# Patient Record
Sex: Male | Born: 1955 | Race: Black or African American | Hispanic: No | Smoking: Current every day smoker
Health system: Southern US, Community
[De-identification: ages and names within clinical notes are randomized; demographics above are authoritative.]

## PROBLEM LIST (undated history)

## (undated) DIAGNOSIS — I10 Essential (primary) hypertension: Secondary | ICD-10-CM

## (undated) HISTORY — PX: LEG SURGERY: SHX1003

---

## 1998-08-07 ENCOUNTER — Emergency Department (HOSPITAL_COMMUNITY): Admission: EM | Admit: 1998-08-07 | Discharge: 1998-08-07 | Payer: Self-pay | Admitting: Emergency Medicine

## 1998-08-09 ENCOUNTER — Encounter: Admission: RE | Admit: 1998-08-09 | Discharge: 1998-11-07 | Payer: Self-pay | Admitting: Internal Medicine

## 2001-04-27 ENCOUNTER — Emergency Department (HOSPITAL_COMMUNITY): Admission: EM | Admit: 2001-04-27 | Discharge: 2001-04-27 | Payer: Self-pay | Admitting: Emergency Medicine

## 2001-04-27 ENCOUNTER — Encounter: Payer: Self-pay | Admitting: Emergency Medicine

## 2001-05-07 ENCOUNTER — Emergency Department (HOSPITAL_COMMUNITY): Admission: EM | Admit: 2001-05-07 | Discharge: 2001-05-07 | Payer: Self-pay | Admitting: Emergency Medicine

## 2005-10-20 ENCOUNTER — Emergency Department (HOSPITAL_COMMUNITY): Admission: EM | Admit: 2005-10-20 | Discharge: 2005-10-21 | Payer: Self-pay | Admitting: Emergency Medicine

## 2011-02-02 ENCOUNTER — Emergency Department (HOSPITAL_COMMUNITY)
Admission: EM | Admit: 2011-02-02 | Discharge: 2011-02-02 | Disposition: A | Discharge status: Home / Self Care | Payer: Self-pay | Attending: Emergency Medicine | Admitting: Emergency Medicine

## 2011-02-02 ENCOUNTER — Emergency Department (HOSPITAL_COMMUNITY): Payer: Self-pay

## 2011-02-02 DIAGNOSIS — R55 Syncope and collapse: Secondary | ICD-10-CM | POA: Insufficient documentation

## 2011-02-02 DIAGNOSIS — R42 Dizziness and giddiness: Secondary | ICD-10-CM | POA: Insufficient documentation

## 2011-02-02 LAB — COMPREHENSIVE METABOLIC PANEL
Albumin: 3.9 g/dL (ref 3.5–5.2)
Alkaline Phosphatase: 56 U/L (ref 39–117)
BUN: 15 mg/dL (ref 6–23)
Chloride: 103 mEq/L (ref 96–112)
Glucose, Bld: 116 mg/dL — ABNORMAL HIGH (ref 70–99)
Potassium: 3.3 mEq/L — ABNORMAL LOW (ref 3.5–5.1)
Total Bilirubin: 0.7 mg/dL (ref 0.3–1.2)

## 2011-02-02 LAB — DIFFERENTIAL
Basophils Absolute: 0 10*3/uL (ref 0.0–0.1)
Basophils Relative: 0 % (ref 0–1)
Eosinophils Absolute: 0.1 10*3/uL (ref 0.0–0.7)
Eosinophils Relative: 2 % (ref 0–5)
Lymphocytes Relative: 30 % (ref 12–46)
Lymphs Abs: 2.1 10*3/uL (ref 0.7–4.0)
Monocytes Absolute: 0.5 10*3/uL (ref 0.1–1.0)
Monocytes Relative: 7 % (ref 3–12)
Neutro Abs: 4.1 10*3/uL (ref 1.7–7.7)
Neutrophils Relative %: 60 % (ref 43–77)

## 2011-02-02 LAB — CBC
HCT: 40.3 % (ref 39.0–52.0)
Hemoglobin: 14.1 g/dL (ref 13.0–17.0)
MCH: 30.8 pg (ref 26.0–34.0)
MCHC: 35 g/dL (ref 30.0–36.0)
MCV: 88 fL (ref 78.0–100.0)
Platelets: 236 10*3/uL (ref 150–400)
RBC: 4.58 MIL/uL (ref 4.22–5.81)
RDW: 15 % (ref 11.5–15.5)
WBC: 6.8 10*3/uL (ref 4.0–10.5)

## 2011-02-02 LAB — POCT CARDIAC MARKERS
CKMB, poc: <1 ng/mL — ABNORMAL LOW (ref 1.0–8.0)
Myoglobin, poc: 85.6 ng/mL (ref 12–200)
Troponin i, poc: <0.05 ng/mL (ref 0.00–0.09)

## 2011-02-02 LAB — ETHANOL: Alcohol, Ethyl (B): 35 mg/dL — ABNORMAL HIGH (ref 0–10)

## 2011-07-15 ENCOUNTER — Emergency Department (HOSPITAL_COMMUNITY): Payer: Self-pay

## 2011-07-15 ENCOUNTER — Emergency Department (HOSPITAL_COMMUNITY)
Admission: EM | Admit: 2011-07-15 | Discharge: 2011-07-16 | Disposition: A | Discharge status: Home / Self Care | Payer: Self-pay | Attending: Emergency Medicine | Admitting: Emergency Medicine

## 2011-07-15 DIAGNOSIS — S0003XA Contusion of scalp, initial encounter: Secondary | ICD-10-CM | POA: Insufficient documentation

## 2011-07-15 DIAGNOSIS — M25519 Pain in unspecified shoulder: Secondary | ICD-10-CM | POA: Insufficient documentation

## 2011-07-15 DIAGNOSIS — R079 Chest pain, unspecified: Secondary | ICD-10-CM | POA: Insufficient documentation

## 2011-07-15 DIAGNOSIS — T07XXXA Unspecified multiple injuries, initial encounter: Secondary | ICD-10-CM | POA: Insufficient documentation

## 2011-07-15 DIAGNOSIS — S1093XA Contusion of unspecified part of neck, initial encounter: Secondary | ICD-10-CM | POA: Insufficient documentation

## 2011-07-15 DIAGNOSIS — R51 Headache: Secondary | ICD-10-CM | POA: Insufficient documentation

## 2011-07-15 DIAGNOSIS — S0230XA Fracture of orbital floor, unspecified side, initial encounter for closed fracture: Secondary | ICD-10-CM | POA: Insufficient documentation

## 2011-07-15 DIAGNOSIS — IMO0002 Reserved for concepts with insufficient information to code with codable children: Secondary | ICD-10-CM | POA: Insufficient documentation

## 2011-07-15 LAB — CBC
Hemoglobin: 14.9 g/dL (ref 13.0–17.0)
MCHC: 35.4 g/dL (ref 30.0–36.0)
Platelets: 244 10*3/uL (ref 150–400)
RDW: 15.1 % (ref 11.5–15.5)

## 2011-07-15 LAB — DIFFERENTIAL
Basophils Absolute: 0.1 10*3/uL (ref 0.0–0.1)
Basophils Relative: 0 % (ref 0–1)
Eosinophils Relative: 1 % (ref 0–5)
Monocytes Absolute: 0.5 10*3/uL (ref 0.1–1.0)
Neutro Abs: 8.7 10*3/uL — ABNORMAL HIGH (ref 1.7–7.7)

## 2011-07-15 LAB — PROTIME-INR
INR: 0.9 (ref 0.00–1.49)
Prothrombin Time: 12.3 seconds (ref 11.6–15.2)

## 2011-07-15 LAB — COMPREHENSIVE METABOLIC PANEL
Albumin: 4.3 g/dL (ref 3.5–5.2)
BUN: 14 mg/dL (ref 6–23)
Calcium: 9.9 mg/dL (ref 8.4–10.5)
Creatinine, Ser: 0.8 mg/dL (ref 0.50–1.35)
Total Protein: 7.9 g/dL (ref 6.0–8.3)

## 2011-07-15 LAB — ETHANOL: Alcohol, Ethyl (B): 167 mg/dL — ABNORMAL HIGH (ref 0–11)

## 2011-07-16 LAB — URINALYSIS, ROUTINE W REFLEX MICROSCOPIC
Leukocytes, UA: NEGATIVE
Nitrite: NEGATIVE
Specific Gravity, Urine: 1.006 (ref 1.005–1.030)
Urobilinogen, UA: 0.2 mg/dL (ref 0.0–1.0)
pH: 6.5 (ref 5.0–8.0)

## 2011-07-16 LAB — URINE MICROSCOPIC-ADD ON

## 2013-07-22 ENCOUNTER — Emergency Department (HOSPITAL_COMMUNITY)
Admission: EM | Admit: 2013-07-22 | Discharge: 2013-07-23 | Disposition: A | Discharge status: Home / Self Care | Payer: Self-pay | Attending: Emergency Medicine | Admitting: Emergency Medicine

## 2013-07-22 ENCOUNTER — Emergency Department (HOSPITAL_COMMUNITY): Payer: Self-pay

## 2013-07-22 ENCOUNTER — Encounter (HOSPITAL_COMMUNITY): Payer: Self-pay | Admitting: Emergency Medicine

## 2013-07-22 DIAGNOSIS — R062 Wheezing: Secondary | ICD-10-CM | POA: Insufficient documentation

## 2013-07-22 DIAGNOSIS — I959 Hypotension, unspecified: Secondary | ICD-10-CM | POA: Insufficient documentation

## 2013-07-22 DIAGNOSIS — E860 Dehydration: Secondary | ICD-10-CM

## 2013-07-22 DIAGNOSIS — R55 Syncope and collapse: Secondary | ICD-10-CM

## 2013-07-22 DIAGNOSIS — F17200 Nicotine dependence, unspecified, uncomplicated: Secondary | ICD-10-CM | POA: Insufficient documentation

## 2013-07-22 LAB — COMPREHENSIVE METABOLIC PANEL
AST: 18 U/L (ref 0–37)
Albumin: 3.8 g/dL (ref 3.5–5.2)
BUN: 21 mg/dL (ref 6–23)
Chloride: 99 mEq/L (ref 96–112)
Creatinine, Ser: 1.41 mg/dL — ABNORMAL HIGH (ref 0.50–1.35)
Total Bilirubin: 0.7 mg/dL (ref 0.3–1.2)
Total Protein: 7.4 g/dL (ref 6.0–8.3)

## 2013-07-22 LAB — CBC WITH DIFFERENTIAL/PLATELET
Basophils Absolute: 0 10*3/uL (ref 0.0–0.1)
Basophils Relative: 1 % (ref 0–1)
Eosinophils Absolute: 0 10*3/uL (ref 0.0–0.7)
HCT: 42.2 % (ref 39.0–52.0)
MCH: 31.1 pg (ref 26.0–34.0)
MCHC: 35.5 g/dL (ref 30.0–36.0)
Monocytes Absolute: 0.6 10*3/uL (ref 0.1–1.0)
Neutro Abs: 5.7 10*3/uL (ref 1.7–7.7)
Neutrophils Relative %: 66 % (ref 43–77)
RDW: 15.2 % (ref 11.5–15.5)

## 2013-07-22 LAB — CK: Total CK: 138 U/L (ref 7–232)

## 2013-07-22 MED ORDER — SODIUM CHLORIDE 0.9 % IV BOLUS (SEPSIS)
1000.0000 mL | Freq: Once | INTRAVENOUS | Status: AC
  Administered 2013-07-22: 1000 mL via INTRAVENOUS

## 2013-07-22 NOTE — ED Notes (Signed)
Per EMS: pt from home.  Was working outside all day and had near syncopal episode. Pt stated he remembers all the events. Also stated he has had an episode or two like this before.  SBP upon EMS arrival was 92. After NS SBP was 116. 20g L FA.

## 2013-07-22 NOTE — ED Notes (Signed)
Pt unable to give urine at this time.

## 2013-07-22 NOTE — ED Provider Notes (Signed)
CSN: [id number]     Arrival date & time 07/22/13  2120 History   First MD Initiated Contact with Patient 07/22/13 2128     Chief Complaint  Patient presents with  . Near Syncope  . Hypotension   (Consider location/radiation/quality/duration/timing/severity/associated sxs/prior Treatment) Patient is a 57 y.o. male presenting with syncope.  Loss of Consciousness Episode history:  Single (Near syncopal episode) Most recent episode:  Today Timing:  Unable to specify Progression:  Partially resolved Chronicity:  Recurrent Context: normal activity   Witnessed: yes   Worsened by:  Posture Associated symptoms: no chest pain, no diaphoresis, no nausea, no shortness of breath and no vomiting     History reviewed. No pertinent past medical history. History reviewed. No pertinent past surgical history. No family history on file. History  Substance Use Topics  . Smoking status: Current Every Day Smoker  . Smokeless tobacco: Never Used  . Alcohol Use: Yes    Review of Systems  Constitutional: Negative for diaphoresis.  Respiratory: Negative for shortness of breath.   Cardiovascular: Positive for syncope. Negative for chest pain.  Gastrointestinal: Negative for nausea and vomiting.  All other systems reviewed and are negative.    Allergies  Tomato and Aspirin  Home Medications  No current outpatient prescriptions on file. BP 144/101  Pulse 87  Temp(Src) 98.3 F (36.8 C) (Oral)  Resp 20  SpO2 96% Physical Exam  Nursing note and vitals reviewed. Constitutional: He is oriented to person, place, and time. He appears well-developed and well-nourished.  HENT:  Head: Normocephalic.  Eyes: EOM are normal. Pupils are equal, round, and reactive to light. Right eye exhibits no nystagmus. Left eye exhibits no nystagmus.  Neck: Normal range of motion. Neck supple.  Cardiovascular: Normal rate, regular rhythm and normal heart sounds.   Pulmonary/Chest: Effort normal. He has wheezes.   Abdominal: Soft. Bowel sounds are normal.  Musculoskeletal: Normal range of motion. He exhibits no edema and no tenderness.  Lymphadenopathy:    He has no cervical adenopathy.  Neurological: He is alert and oriented to person, place, and time.  Skin: Skin is warm and dry.  Psychiatric: He has a normal mood and affect. His behavior is normal. Judgment and thought content normal.    ED Course  Procedures (including critical care time) Labs Review Labs Reviewed - No data to display Imaging Review No results found.   Not currently orthostatic. ECG NSR--no chest pain.  Elevated creatinine, CK normal.  Suspect dehydration.  UA pending.  Patient signed out to H. Anne Shutter at shift change. MDM   Dehydration with near orthostatic syncope.    Jimmye Norman, NP 07/23/13 [phone]

## 2013-07-23 LAB — URINALYSIS, ROUTINE W REFLEX MICROSCOPIC
Glucose, UA: NEGATIVE mg/dL
Hgb urine dipstick: NEGATIVE
Leukocytes, UA: NEGATIVE
Specific Gravity, Urine: 1.013 (ref 1.005–1.030)
pH: 6 (ref 5.0–8.0)

## 2013-07-23 MED ORDER — SODIUM CHLORIDE 0.9 % IV BOLUS (SEPSIS)
1000.0000 mL | Freq: Once | INTRAVENOUS | Status: AC
  Administered 2013-07-23: 1000 mL via INTRAVENOUS

## 2013-07-23 NOTE — ED Provider Notes (Signed)
Medical screening examination/treatment/procedure(s) were performed by non-physician practitioner and as supervising physician I was immediately available for consultation/collaboration.  Toy Baker, MD 07/23/13 [phone]

## 2013-07-23 NOTE — ED Provider Notes (Signed)
  Patient signed out by Felicie Morn, NP.  Patient presented today with dehydration and syncope.  UA pending.  Plan is for the patient to be discharged once the UA has resulted.  UA does not show an infection.  Patient is stable for discharge.  Pascal Lux [place], PA-C 07/23/13 2238

## 2013-07-24 NOTE — ED Provider Notes (Signed)
Medical screening examination/treatment/procedure(s) were performed by non-physician practitioner and as supervising physician I was immediately available for consultation/collaboration.  Joyice Magda M Tori Cupps, MD 07/24/13 0435 

## 2014-04-30 ENCOUNTER — Emergency Department (HOSPITAL_COMMUNITY): Payer: Self-pay

## 2014-04-30 ENCOUNTER — Encounter (HOSPITAL_COMMUNITY): Payer: Self-pay | Admitting: Emergency Medicine

## 2014-04-30 ENCOUNTER — Emergency Department (HOSPITAL_COMMUNITY)
Admission: EM | Admit: 2014-04-30 | Discharge: 2014-04-30 | Disposition: A | Discharge status: Home / Self Care | Payer: Self-pay | Attending: Emergency Medicine | Admitting: Emergency Medicine

## 2014-04-30 DIAGNOSIS — R1011 Right upper quadrant pain: Secondary | ICD-10-CM | POA: Insufficient documentation

## 2014-04-30 DIAGNOSIS — I10 Essential (primary) hypertension: Secondary | ICD-10-CM | POA: Insufficient documentation

## 2014-04-30 DIAGNOSIS — R42 Dizziness and giddiness: Secondary | ICD-10-CM | POA: Insufficient documentation

## 2014-04-30 DIAGNOSIS — F17200 Nicotine dependence, unspecified, uncomplicated: Secondary | ICD-10-CM | POA: Insufficient documentation

## 2014-04-30 DIAGNOSIS — R079 Chest pain, unspecified: Secondary | ICD-10-CM | POA: Insufficient documentation

## 2014-04-30 DIAGNOSIS — R61 Generalized hyperhidrosis: Secondary | ICD-10-CM | POA: Insufficient documentation

## 2014-04-30 DIAGNOSIS — R112 Nausea with vomiting, unspecified: Secondary | ICD-10-CM | POA: Insufficient documentation

## 2014-04-30 DIAGNOSIS — Z79899 Other long term (current) drug therapy: Secondary | ICD-10-CM | POA: Insufficient documentation

## 2014-04-30 HISTORY — DX: Essential (primary) hypertension: I10

## 2014-04-30 LAB — CBC
HEMATOCRIT: 44.1 % (ref 39.0–52.0)
Hemoglobin: 15.3 g/dL (ref 13.0–17.0)
MCH: 30.7 pg (ref 26.0–34.0)
MCHC: 34.7 g/dL (ref 30.0–36.0)
MCV: 88.4 fL (ref 78.0–100.0)
PLATELETS: 242 10*3/uL (ref 150–400)
RBC: 4.99 MIL/uL (ref 4.22–5.81)
RDW: 14.4 % (ref 11.5–15.5)
WBC: 5.8 10*3/uL (ref 4.0–10.5)

## 2014-04-30 LAB — LIPASE, BLOOD: LIPASE: 24 U/L (ref 11–59)

## 2014-04-30 LAB — COMPREHENSIVE METABOLIC PANEL
ALBUMIN: 4.1 g/dL (ref 3.5–5.2)
ALK PHOS: 75 U/L (ref 39–117)
ALT: 15 U/L (ref 0–53)
AST: 35 U/L (ref 0–37)
BILIRUBIN TOTAL: 0.9 mg/dL (ref 0.3–1.2)
BUN: 21 mg/dL (ref 6–23)
CHLORIDE: 98 meq/L (ref 96–112)
CO2: 25 meq/L (ref 19–32)
Calcium: 9.9 mg/dL (ref 8.4–10.5)
Creatinine, Ser: 1.54 mg/dL — ABNORMAL HIGH (ref 0.50–1.35)
GFR calc Af Amer: 56 mL/min — ABNORMAL LOW (ref >90)
GFR, EST NON AFRICAN AMERICAN: 48 mL/min — AB (ref >90)
Glucose, Bld: 95 mg/dL (ref 70–99)
POTASSIUM: 4 meq/L (ref 3.7–5.3)
Sodium: 140 mEq/L (ref 137–147)
Total Protein: 8.3 g/dL (ref 6.0–8.3)

## 2014-04-30 LAB — I-STAT TROPONIN, ED: TROPONIN I, POC: 0 ng/mL (ref 0.00–0.08)

## 2014-04-30 MED ORDER — ONDANSETRON HCL 4 MG PO TABS: 4.0000 mg | ORAL_TABLET | Freq: Four times a day (QID) | ORAL | Status: DC

## 2014-04-30 MED ORDER — ALPRAZOLAM 0.5 MG PO TABS: 0.2500 mg | ORAL_TABLET | Freq: Once | ORAL | Status: DC

## 2014-04-30 NOTE — ED Provider Notes (Signed)
CSN: [id number]     Arrival date & time 04/30/14  1826 History   First MD Initiated Contact with Patient 04/30/14 1834     Chief Complaint  Patient presents with  . Abdominal Pain  . Chest Pain     (Consider location/radiation/quality/duration/timing/severity/associated sxs/prior Treatment) Patient is a 58 y.o. male presenting with abdominal pain. The history is provided by the patient. No language interpreter was used.  Abdominal Pain Pain location:  RUQ Pain quality: sharp   Pain radiates to:  Does not radiate Pain severity:  Moderate Onset quality:  Sudden Duration:  1 day Timing:  Intermittent Progression:  Resolved Chronicity:  Recurrent Context: not awakening from sleep, not diet changes, not eating, not medication withdrawal, not previous surgeries, not recent illness, not sick contacts, not suspicious food intake and not trauma   Relieved by: unsure. Worsened by:  Nothing tried Ineffective treatments:  None tried Associated symptoms: chest pain, nausea and vomiting   Associated symptoms: no constipation, no cough, no diarrhea, no dysuria, no fatigue, no fever and no shortness of breath     Past Medical History  Diagnosis Date  . Hypertension    History reviewed. No pertinent past surgical history. No family history on file. History  Substance Use Topics  . Smoking status: Current Every Day Smoker  . Smokeless tobacco: Never Used  . Alcohol Use: Yes    Review of Systems  Constitutional: Positive for diaphoresis. Negative for fever, activity change, appetite change and fatigue.  HENT: Negative for congestion, facial swelling, rhinorrhea and trouble swallowing.   Eyes: Negative for photophobia and pain.  Respiratory: Negative for cough, chest tightness and shortness of breath.   Cardiovascular: Positive for chest pain. Negative for leg swelling.  Gastrointestinal: Positive for nausea, vomiting and abdominal pain. Negative for diarrhea and constipation.    Endocrine: Negative for polydipsia and polyuria.  Genitourinary: Negative for dysuria, urgency, decreased urine volume and difficulty urinating.  Musculoskeletal: Negative for back pain and gait problem.  Skin: Negative for color change, rash and wound.  Allergic/Immunologic: Negative for immunocompromised state.  Neurological: Positive for dizziness. Negative for facial asymmetry, speech difficulty, weakness, numbness and headaches.  Psychiatric/Behavioral: Negative for confusion, decreased concentration and agitation.      Allergies  Tomato and Aspirin  Home Medications   Prior to Admission medications   Medication Sig Start Date End Date Taking? Authorizing Provider  ondansetron (ZOFRAN) 4 MG tablet Take 1 tablet (4 mg total) by mouth every 6 (six) hours. 04/30/14   Shanna Cisco, MD   BP 113/83  Pulse 85  Temp(Src) 97.7 F (36.5 C) (Oral)  Resp 16  SpO2 96% Physical Exam  Constitutional: He is oriented to person, place, and time. He appears well-developed and well-nourished. No distress.  HENT:  Head: Normocephalic and atraumatic.  Mouth/Throat: No oropharyngeal exudate.  Eyes: Pupils are equal, round, and reactive to light.  Neck: Normal range of motion. Neck supple.  Cardiovascular: Normal rate, regular rhythm and normal heart sounds.  Exam reveals no gallop and no friction rub.   No murmur heard. Pulmonary/Chest: Effort normal and breath sounds normal. No respiratory distress. He has no wheezes. He has no rales.  Abdominal: Soft. Bowel sounds are normal. He exhibits no distension and no mass. There is no tenderness. There is no rebound and no guarding.    Musculoskeletal: Normal range of motion. He exhibits no edema and no tenderness.  Neurological: He is alert and oriented to person, place, and time.  Skin:  Skin is warm and dry.  Psychiatric: He has a normal mood and affect.    ED Course  Procedures (including critical care time) Labs Review Labs Reviewed   COMPREHENSIVE METABOLIC PANEL - Abnormal; Notable for the following:    Creatinine, Ser 1.54 (*)    GFR calc non Af Amer 48 (*)    GFR calc Af Amer 56 (*)    All other components within normal limits  LIPASE, BLOOD  URINALYSIS, ROUTINE W REFLEX MICROSCOPIC  CBC  I-STAT TROPOININ, ED    Imaging Review Dg Chest 2 View  04/30/2014   CLINICAL DATA:  Right-sided chest pain  EXAM: CHEST  2 VIEW  COMPARISON:  07/22/2013  FINDINGS: The heart size and mediastinal contours are within normal limits. Both lungs are clear. The visualized skeletal structures are unremarkable.  IMPRESSION: No active cardiopulmonary disease.   Electronically Signed   By: Alcide Clever M.D.   On: 04/30/2014 19:51   US Abdomen Limited Ruq  04/30/2014   CLINICAL DATA:  Right upper quadrant pain.  EXAM: US ABDOMEN LIMITED - RIGHT UPPER QUADRANT  COMPARISON:  None.  FINDINGS: Gallbladder:  No gallstones or wall thickening visualized. No sonographic Murphy sign noted.  Common bile duct:  Diameter: Upper limits of normal measuring 7 mm.  Liver:  No focal lesion identified. Slight increased echogenicity. No biliary ductal dilatation.  IMPRESSION: No evidence for cholelithiasis or biliary ductal dilatation.   Electronically Signed   By: Davonna Belling M.D.   On: 04/30/2014 20:15     EKG Interpretation None      Date: 04/30/2014  Rate: 78  Rhythm: normal sinus rhythm  QRS Axis: normal  Intervals: normal  ST/T Wave abnormalities: nonspecific ST/T changes  Conduction Disutrbances:possible LA enlargement  Narrative Interpretation:   Old EKG Reviewed: none available    MDM   Final diagnoses:  RUQ pain    Pt is a 58 y.o. male with Pmhx as above who presents with 1 day of intermittent RUQ pain w/ assoc n/v, and episode of dizziness/diaphoresis. He reports hx of same in past. No no cardiovascular or gallbladder pathology. TIMI 0. He has no pain/no complaints currently.  No acute appearing ischemic changes of EKG, trop  negative, CMP w/ Cr 1.5, LFTs ovtherwise nml, lipase nml. RUQ [place] nml.  Pending acute abnormality, will d/c home w/ Rx for zofran.  Have counseled pt in return precautions for new or worsening symptoms as well as establishing with a local PCP, Referral to Dr. Ashley Royalty or [facility].        Shanna Cisco, MD 05/01/14 [phone]

## 2014-04-30 NOTE — ED Notes (Signed)
Per EMS: pt reports upper abdominal pain. 20 G L AC. 4 Zofran administered. EKG negative with EMS. Hx of HTN no medication to treat. 2 episodes of n/v en route.

## 2014-04-30 NOTE — Discharge Instructions (Signed)
Abdominal Pain, Adult °Many things can cause abdominal pain. Usually, abdominal pain is not caused by a disease and will improve without treatment. It can often be observed and treated at home. Your health care provider will do a physical exam and possibly order blood tests and X-rays to help determine the seriousness of your pain. However, in many cases, more time must pass before a clear cause of the pain can be found. Before that point, your health care provider may not know if you need more testing or further treatment. °HOME CARE INSTRUCTIONS  °Monitor your abdominal pain for any changes. The following actions may help to alleviate any discomfort you are experiencing: °· Only take over-the-counter or prescription medicines as directed by your health care provider. °· Do not take laxatives unless directed to do so by your health care provider. °· Try a clear liquid diet (broth, tea, or water) as directed by your health care provider. Slowly move to a bland diet as tolerated. °SEEK MEDICAL CARE IF: °· You have unexplained abdominal pain. °· You have abdominal pain associated with nausea or diarrhea. °· You have pain when you urinate or have a bowel movement. °· You experience abdominal pain that wakes you in the night. °· You have abdominal pain that is worsened or improved by eating food. °· You have abdominal pain that is worsened with eating fatty foods. °SEEK IMMEDIATE MEDICAL CARE IF:  °· Your pain does not go away within 2 hours. °· You have a fever. °· You keep throwing up (vomiting). °· Your pain is felt only in portions of the abdomen, such as the right side or the left lower portion of the abdomen. °· You pass bloody or black tarry stools. °MAKE SURE YOU: °· Understand these instructions.   °· Will watch your condition.   °· Will get help right away if you are not doing well or get worse.   °Document Released: 08/16/2005 Document Revised: 08/27/2013 Document Reviewed: 07/16/2013 °[employer]® Patient  Information ©2014 [employer]. ° °

## 2014-04-30 NOTE — ED Notes (Signed)
Main lab called to add CBC, Ria at lab states she will add.

## 2016-03-04 ENCOUNTER — Emergency Department (HOSPITAL_COMMUNITY)
Admission: EM | Admit: 2016-03-04 | Discharge: 2016-03-05 | Disposition: A | Discharge status: Home / Self Care | Payer: Self-pay | Attending: Emergency Medicine | Admitting: Emergency Medicine

## 2016-03-04 ENCOUNTER — Encounter (HOSPITAL_COMMUNITY): Payer: Self-pay | Admitting: Emergency Medicine

## 2016-03-04 DIAGNOSIS — M069 Rheumatoid arthritis, unspecified: Secondary | ICD-10-CM | POA: Insufficient documentation

## 2016-03-04 DIAGNOSIS — F17200 Nicotine dependence, unspecified, uncomplicated: Secondary | ICD-10-CM | POA: Insufficient documentation

## 2016-03-04 DIAGNOSIS — I10 Essential (primary) hypertension: Secondary | ICD-10-CM | POA: Insufficient documentation

## 2016-03-04 LAB — CBC WITH DIFFERENTIAL/PLATELET
BASOS ABS: 0 10*3/uL (ref 0.0–0.1)
BASOS PCT: 1 %
EOS ABS: 0.2 10*3/uL (ref 0.0–0.7)
EOS PCT: 3 %
HCT: 42.3 % (ref 39.0–52.0)
Hemoglobin: 14.5 g/dL (ref 13.0–17.0)
LYMPHS PCT: 55 %
Lymphs Abs: 3.8 10*3/uL (ref 0.7–4.0)
MCH: 30.4 pg (ref 26.0–34.0)
MCHC: 34.3 g/dL (ref 30.0–36.0)
MCV: 88.7 fL (ref 78.0–100.0)
Monocytes Absolute: 0.4 10*3/uL (ref 0.1–1.0)
Monocytes Relative: 6 %
Neutro Abs: 2.4 10*3/uL (ref 1.7–7.7)
Neutrophils Relative %: 35 %
PLATELETS: 298 10*3/uL (ref 150–400)
RBC: 4.77 MIL/uL (ref 4.22–5.81)
RDW: 14.5 % (ref 11.5–15.5)
WBC: 6.7 10*3/uL (ref 4.0–10.5)

## 2016-03-04 LAB — COMPREHENSIVE METABOLIC PANEL
ALBUMIN: 4.2 g/dL (ref 3.5–5.0)
ALT: 16 U/L — AB (ref 17–63)
AST: 27 U/L (ref 15–41)
Alkaline Phosphatase: 66 U/L (ref 38–126)
Anion gap: 13 (ref 5–15)
BUN: 11 mg/dL (ref 6–20)
CHLORIDE: 103 mmol/L (ref 101–111)
CO2: 20 mmol/L — AB (ref 22–32)
CREATININE: 1.02 mg/dL (ref 0.61–1.24)
Calcium: 9.2 mg/dL (ref 8.9–10.3)
GFR calc Af Amer: >60 mL/min (ref >60)
GFR calc non Af Amer: >60 mL/min (ref >60)
Glucose, Bld: 97 mg/dL (ref 65–99)
Potassium: 4.1 mmol/L (ref 3.5–5.1)
SODIUM: 136 mmol/L (ref 135–145)
Total Bilirubin: 0.5 mg/dL (ref 0.3–1.2)
Total Protein: 8.1 g/dL (ref 6.5–8.1)

## 2016-03-04 MED ORDER — PREDNISONE 20 MG PO TABS
60.0000 mg | ORAL_TABLET | Freq: Once | ORAL | Status: AC
  Administered 2016-03-04: 60 mg via ORAL
  Filled 2016-03-04: qty 3

## 2016-03-04 MED ORDER — PREDNISONE 20 MG PO TABS: 60.0000 mg | ORAL_TABLET | Freq: Every day | ORAL | Status: DC

## 2016-03-04 MED ORDER — ACETAMINOPHEN 500 MG PO TABS
1000.0000 mg | ORAL_TABLET | Freq: Once | ORAL | Status: AC
  Administered 2016-03-04: 1000 mg via ORAL
  Filled 2016-03-04: qty 2

## 2016-03-04 NOTE — ED Provider Notes (Signed)
CSN: [id number]     Arrival date & time 03/04/16  2137 History   By signing my name below, I, Cory Curtis, attest that this documentation has been prepared under the direction and in the presence of [name], MD.  Electronically Signed: Arlan Curtis, ED Scribe. 03/04/2016. 11:29 PM.   Chief Complaint  Patient presents with  . Hand Pain   HPI  HPI Comments: Cory Curtis is a 60 y.o. male with a PMHx of HTN who presents to the Emergency Department complaining of constant, ongoing bilateral hand pain with associated swelling x few months; worsened this evening. Pain is described as throbbing. No recent injury, trauma, or falls. No aggravating or alleviating factors reported. No OTC medications or home remedies attempted prior to arrival. No recent fever, chills, nausea, vomiting. Pt denies any history of same. Pt with known allergy to ASA.  PCP: No primary care provider on file.    Past Medical History  Diagnosis Date  . Hypertension    History reviewed. No pertinent past surgical history. No family history on file. Social History  Substance Use Topics  . Smoking status: Current Every Day Smoker  . Smokeless tobacco: Never Used  . Alcohol Use: Yes    Review of Systems  A complete 10 system review of systems was obtained and all systems are negative except as noted in the HPI and PMH.    Allergies  Tomato and Aspirin  Home Medications   Prior to Admission medications   Medication Sig Start Date End Date Taking? Authorizing Provider  ondansetron (ZOFRAN) 4 MG tablet Take 1 tablet (4 mg total) by mouth every 6 (six) hours. 04/30/14   Toy Cookey, MD   Triage Vitals: BP 137/93 mmHg  Pulse 85  Temp(Src) 97.2 F (36.2 C) (Oral)  Resp 18  Ht 5\' 9"  (1.753 m)  Wt 153 lb 2 oz (69.457 kg)  BMI 22.60 kg/m2  SpO2 92%   Physical Exam  Constitutional: He is oriented to person, place, and time. Vital signs are normal. He appears well-developed and well-nourished.  Non-toxic  appearance. He does not appear ill. No distress.  HENT:  Head: Normocephalic and atraumatic.  Nose: Nose normal.  Mouth/Throat: Oropharynx is clear and moist. No oropharyngeal exudate.  Eyes: Conjunctivae and EOM are normal. Pupils are equal, round, and reactive to light. No scleral icterus.  Neck: Normal range of motion. Neck supple. No tracheal deviation, no edema, no erythema and normal range of motion present. No thyroid mass and no thyromegaly present.  Cardiovascular: Normal rate, regular rhythm, S1 normal, S2 normal, normal heart sounds, intact distal pulses and normal pulses.  Exam reveals no gallop and no friction rub.   No murmur heard. Pulmonary/Chest: Effort normal and breath sounds normal. No respiratory distress. He has no wheezes. He has no rhonchi. He has no rales.  Abdominal: Soft. Normal appearance and bowel sounds are normal. He exhibits no distension, no ascites and no mass. There is no hepatosplenomegaly. There is no tenderness. There is no rebound, no guarding and no CVA tenderness.  Musculoskeletal: Normal range of motion. He exhibits edema. He exhibits no tenderness.  All 10 digits are diffusely swollen No erythema or TTP ROM of all limbs are normal   Lymphadenopathy:    He has no cervical adenopathy.  Neurological: He is alert and oriented to person, place, and time. He has normal strength. No cranial nerve deficit or sensory deficit.  Skin: Skin is warm, dry and intact. No petechiae and  no rash noted. He is not diaphoretic. No erythema. No pallor.  Psychiatric: He has a normal mood and affect. His behavior is normal. Judgment normal.  Nursing note and vitals reviewed.   ED Course  Procedures (including critical care time)  DIAGNOSTIC STUDIES: Oxygen Saturation is 92% on RA, adequate by my interpretation.    COORDINATION OF CARE: 11:13 PM- Will order CBC and CMP. Discussed treatment plan with pt at bedside and pt agreed to plan.     Labs Review Labs Reviewed   COMPREHENSIVE METABOLIC PANEL - Abnormal; Notable for the following:    CO2 20 (*)    ALT 16 (*)    All other components within normal limits  CBC WITH DIFFERENTIAL/PLATELET    Imaging Review No results found. I have personally reviewed and evaluated these images and lab results as part of my medical decision-making.   EKG Interpretation None      MDM   Final diagnoses:  None   Patient presents to the ED for hand pain.  PE is consistent with RA, he was advised to see a PCP within 3 days for further treatment.  He was given tylenol for pain and prednisone for his flare.  Will DC with Rx for 4 more days of prednisone.  He appears well and in NAD.  VS remain within his normal limits and he is safe for DC.    I personally performed the services described in this documentation, which was scribed in my presence. The recorded information has been reviewed and is accurate.     [name], MD 03/04/16 2330

## 2016-03-04 NOTE — ED Notes (Signed)
Dr. Oni at the bedside.  

## 2016-03-04 NOTE — Discharge Instructions (Signed)
Rheumatoid Arthritis Cory Curtis, take prednisone for 5 days to help with the pain and swelling in your hands.  Take tylenol daily for the pain. See a primary care doctor within 3 days for close follow up. If symptoms worsen, come back to the ED immediately.  Thank you. Rheumatoid arthritis is a disease that causes pain, puffiness (swelling), and stiffness of the joints. It is a long-term (chronic) disease. It can affect the whole body, even the eyes and lungs. Your doctor will work with you to find the best treatment option for you. This will depend on how the disease is progressing in your body. HOME CARE  Stay active, but lessen activity when the disease gets worse.  Eat healthy foods.  Put heat on the affected joints when you wake up and before activity. Keep the heat on for as long as told by your doctor.  Put ice on the affected joints after activity or exercise.  Put ice in a plastic bag.  Place a towel between your skin and the bag.  Leave the ice on for 15 to 20 minutes, 3 to 4 times a day.  Take all medicines and other dietary pills (supplements) only as told by your doctor. Your doctor may adjust your medicines every 3 months.  Use a splint as told by your doctor. Splints help keep joints in a certain position to keep the joint working right.  Do not sleep with pillows under your knees.  Go to programs that can keep you updated on treatments and ways to deal with your disease. GET HELP RIGHT AWAY IF:  You have times where you pass out (faint).  You have times where you are really weak.  You suddenly have a hot, painful joint that feels worse than your normal joint ache.  You have chills.  You have a fever.   This information is not intended to replace advice given to you by your health care provider. Make sure you discuss any questions you have with your health care provider.   Document Released: 01/29/2012 Document Revised: 11/27/2014 Document Reviewed:  01/29/2012 Elsevier Interactive Patient Education [employer]

## 2016-03-04 NOTE — ED Notes (Signed)
Pt. reports chronic bilateral hand pain with swelling onset last year worse these past several days , denies injury . No fever or chills.

## 2016-03-08 ENCOUNTER — Encounter: Payer: Self-pay | Admitting: Internal Medicine

## 2016-03-08 ENCOUNTER — Ambulatory Visit: Payer: Self-pay | Attending: Internal Medicine | Admitting: Internal Medicine

## 2016-03-08 VITALS — BP 162/76 | HR 88 | Temp 97.5°F | Ht 69.0 in | Wt 153.0 lb

## 2016-03-08 DIAGNOSIS — Z888 Allergy status to other drugs, medicaments and biological substances status: Secondary | ICD-10-CM | POA: Insufficient documentation

## 2016-03-08 DIAGNOSIS — M069 Rheumatoid arthritis, unspecified: Secondary | ICD-10-CM | POA: Insufficient documentation

## 2016-03-08 DIAGNOSIS — M1990 Unspecified osteoarthritis, unspecified site: Secondary | ICD-10-CM | POA: Insufficient documentation

## 2016-03-08 DIAGNOSIS — F17210 Nicotine dependence, cigarettes, uncomplicated: Secondary | ICD-10-CM | POA: Insufficient documentation

## 2016-03-08 DIAGNOSIS — I10 Essential (primary) hypertension: Secondary | ICD-10-CM | POA: Insufficient documentation

## 2016-03-08 MED ORDER — LISINOPRIL 20 MG PO TABS: 20.0000 mg | ORAL_TABLET | Freq: Every day | ORAL | Status: DC

## 2016-03-08 MED ORDER — IBUPROFEN 200 MG PO TABS: 200.0000 mg | ORAL_TABLET | Freq: Four times a day (QID) | ORAL | Status: DC | PRN

## 2016-03-08 NOTE — Progress Notes (Signed)
Patient here for rheumatoid arthritis in both hands with swelling up to both elbows.  Patient stated he went to the ED and they gave him Prednisone to reduce the swelling and patient reports it has helped.  Patient denies pain.    BP 162/76  [facility] note  Patient comes in for follow up from ED- Went to ED for hand pain. Thought to have RA and treated with short course of prednisone He is feeling much better with decreased pain and decreased swelling.   He has a long hx of swollen fingers/wrists. Hands are stiff for 1 hour every morning.  Currenlty feels well.  Past Medical History  Diagnosis Date  . Hypertension     Social History   Social History  . Marital Status: Divorced    Spouse Name: N/A  . Number of Children: N/A  . Years of Education: N/A   Occupational History  . Not on file.   Social History Main Topics  . Smoking status: Current Every Day Smoker -- 0.50 packs/day    Types: Cigarettes  . Smokeless tobacco: Never Used  . Alcohol Use: 3.0 oz/week    5 Cans of beer per week  . Drug Use: No  . Sexual Activity: Not on file   Other Topics Concern  . Not on file   Social History Narrative    History reviewed. No pertinent past surgical history.  History reviewed. No pertinent family history.  Allergies  Allergen Reactions  . Tomato Swelling    Swelling of the face  . Aspirin Other (See Comments)    "it's like I'm high and everything is moving slow"    Current Outpatient Prescriptions on File Prior to Visit  Medication Sig Dispense Refill  . predniSONE (DELTASONE) 20 MG tablet Take 3 tablets (60 mg total) by mouth daily. 12 tablet 0   No current facility-administered medications on file prior to visit.     patient denies chest pain, shortness of breath, orthopnea. Denies lower extremity edema, abdominal pain, change in appetite, change in bowel movements. Patient denies rashes, musculoskeletal complaints. No other specific complaints in a complete review  of systems.   BP 162/76 mmHg  Pulse 88  Temp(Src) 97.5 F (36.4 C) (Oral)  Ht 5\' 9"  (1.753 m)  Wt 153 lb (69.4 kg)  BMI 22.58 kg/m2  SpO2 99% Recheck bp at 150/100 nad Thin Chest clear cv- reg rate abd- soft, non tender extr- no edema  Musculosk: no active swelling or tenosynovitis. He has enlarged MCP/pip/dip joints diffusely.   Hypertension He has hypertension- needs treatment Start with ACE-i discussed side effects  Recheck in 2-4 weeks  Rheumatoid arthritis (HCC) This is a presumptive diagnosis- i'll check lab work now and have him f/u in 2 weeks.   Ok to take NSAIDs in the meantime i'd likely start with hydroxychloroquine   allergy to aspirin- can tolerate nsaids.

## 2016-03-08 NOTE — Assessment & Plan Note (Signed)
This is a presumptive diagnosis- i'll check lab work now and have him f/u in 2 weeks.   Ok to take NSAIDs in the meantime i'd likely start with hydroxychloroquine

## 2016-03-08 NOTE — Assessment & Plan Note (Signed)
He has hypertension- needs treatment Start with ACE-i discussed side effects  Recheck in 2-4 weeks

## 2016-03-09 LAB — RHEUMATOID FACTOR: RHEUMATOID FACTOR: 11 [IU]/mL (ref <=14)

## 2016-03-09 LAB — CYCLIC CITRUL PEPTIDE ANTIBODY, IGG

## 2016-03-29 ENCOUNTER — Encounter: Payer: Self-pay | Admitting: Family Medicine

## 2016-03-29 ENCOUNTER — Ambulatory Visit: Payer: Self-pay | Attending: Family Medicine | Admitting: Family Medicine

## 2016-03-29 VITALS — BP 148/94 | HR 90 | Temp 98.1°F | Resp 18 | Ht 69.0 in | Wt 153.0 lb

## 2016-03-29 DIAGNOSIS — M1991 Primary osteoarthritis, unspecified site: Secondary | ICD-10-CM | POA: Insufficient documentation

## 2016-03-29 DIAGNOSIS — M150 Primary generalized (osteo)arthritis: Secondary | ICD-10-CM

## 2016-03-29 DIAGNOSIS — M159 Polyosteoarthritis, unspecified: Secondary | ICD-10-CM

## 2016-03-29 DIAGNOSIS — Z79899 Other long term (current) drug therapy: Secondary | ICD-10-CM | POA: Insufficient documentation

## 2016-03-29 DIAGNOSIS — M62831 Muscle spasm of calf: Secondary | ICD-10-CM | POA: Insufficient documentation

## 2016-03-29 DIAGNOSIS — I10 Essential (primary) hypertension: Secondary | ICD-10-CM | POA: Insufficient documentation

## 2016-03-29 MED ORDER — NAPROXEN 500 MG PO TABS: 500.0000 mg | ORAL_TABLET | Freq: Two times a day (BID) | ORAL | Status: DC

## 2016-03-29 MED ORDER — AMLODIPINE BESYLATE 10 MG PO TABS: 10.0000 mg | ORAL_TABLET | Freq: Every day | ORAL | Status: DC

## 2016-03-29 MED ORDER — CYCLOBENZAPRINE HCL 10 MG PO TABS: 10.0000 mg | ORAL_TABLET | Freq: Every day | ORAL | Status: DC

## 2016-03-29 NOTE — Progress Notes (Signed)
Patient's here for f/up HTN and Arthiritis in arms and shoulder.  Pain rated 1/10 R hand, 5/10 L hand sometimes it's worse sometimes is not as bad.  Patient requesting Rx of ibuprofen verses OTC that's not working.  Patient states he took his meds at 7 this morning.

## 2016-03-29 NOTE — Addendum Note (Signed)
Addended by: Benjamin Stain L on: 03/29/2016 05:14 PM   Modules accepted: Kipp Brood

## 2016-03-29 NOTE — Patient Instructions (Signed)
Hypertension Hypertension, commonly called high blood pressure, is when the force of blood pumping through your arteries is too strong. Your arteries are the blood vessels that carry blood from your heart throughout your body. A blood pressure reading consists of a higher number over a lower number, such as 110/72. The higher number (systolic) is the pressure inside your arteries when your heart pumps. The lower number (diastolic) is the pressure inside your arteries when your heart relaxes. Ideally you want your blood pressure below 120/80. Hypertension forces your heart to work harder to pump blood. Your arteries may become narrow or stiff. Having untreated or uncontrolled hypertension can cause heart attack, stroke, kidney disease, and other problems. RISK FACTORS Some risk factors for high blood pressure are controllable. Others are not.  Risk factors you cannot control include:   Race. You may be at higher risk if you are African American.  Age. Risk increases with age.  Gender. Men are at higher risk than women before age 45 years. After age 65, women are at higher risk than men. Risk factors you can control include:  Not getting enough exercise or physical activity.  Being overweight.  Getting too much fat, sugar, calories, or salt in your diet.  Drinking too much alcohol. SIGNS AND SYMPTOMS Hypertension does not usually cause signs or symptoms. Extremely high blood pressure (hypertensive crisis) may cause headache, anxiety, shortness of breath, and nosebleed. DIAGNOSIS To check if you have hypertension, your health care provider will measure your blood pressure while you are seated, with your arm held at the level of your heart. It should be measured at least twice using the same arm. Certain conditions can cause a difference in blood pressure between your right and left arms. A blood pressure reading that is higher than normal on one occasion does not mean that you need treatment. If  it is not clear whether you have high blood pressure, you may be asked to return on a different day to have your blood pressure checked again. Or, you may be asked to monitor your blood pressure at home for 1 or more weeks. TREATMENT Treating high blood pressure includes making lifestyle changes and possibly taking medicine. Living a healthy lifestyle can help lower high blood pressure. You may need to change some of your habits. Lifestyle changes may include:  Following the DASH diet. This diet is high in fruits, vegetables, and whole grains. It is low in salt, red meat, and added sugars.  Keep your sodium intake below 2,300 mg per day.  Getting at least 30-45 minutes of aerobic exercise at least 4 times per week.  Losing weight if necessary.  Not smoking.  Limiting alcoholic beverages.  Learning ways to reduce stress. Your health care provider may prescribe medicine if lifestyle changes are not enough to get your blood pressure under control, and if one of the following is true:  You are 18-59 years of age and your systolic blood pressure is above 140.  You are 60 years of age or older, and your systolic blood pressure is above 150.  Your diastolic blood pressure is above 90.  You have diabetes, and your systolic blood pressure is over 140 or your diastolic blood pressure is over 90.  You have kidney disease and your blood pressure is above 140/90.  You have heart disease and your blood pressure is above 140/90. Your personal target blood pressure may vary depending on your medical conditions, your age, and other factors. HOME CARE INSTRUCTIONS    Have your blood pressure rechecked as directed by your health care provider.   Take medicines only as directed by your health care provider. Follow the directions carefully. Blood pressure medicines must be taken as prescribed. The medicine does not work as well when you skip doses. Skipping doses also puts you at risk for  problems.  Do not smoke.   Monitor your blood pressure at home as directed by your health care provider. SEEK MEDICAL CARE IF:   You think you are having a reaction to medicines taken.  You have recurrent headaches or feel dizzy.  You have swelling in your ankles.  You have trouble with your vision. SEEK IMMEDIATE MEDICAL CARE IF:  You develop a severe headache or confusion.  You have unusual weakness, numbness, or feel faint.  You have severe chest or abdominal pain.  You vomit repeatedly.  You have trouble breathing. MAKE SURE YOU:   Understand these instructions.  Will watch your condition.  Will get help right away if you are not doing well or get worse.   This information is not intended to replace advice given to you by your health care provider. Make sure you discuss any questions you have with your health care provider.   Document Released: 11/06/2005 Document Revised: 03/23/2015 Document Reviewed: 08/29/2013 Elsevier Interactive Patient Education 2016 [employer] Inc[employer]  

## 2016-03-29 NOTE — Addendum Note (Signed)
Addended by: Benjamin Stain L on: 03/29/2016 03:58 PM   Modules accepted: Kipp Brood

## 2016-03-29 NOTE — Progress Notes (Signed)
Subjective:  Patient ID: Cory Curtis, male    DOB: 11-Oct-1956  Age: 60 y.o. MRN: [id number]  CC: Follow-up; Hypertension; and Arthritis   HPI [name] is a 60 year old male with a history of newly diagnosed hypertension commenced on lisinopril by Dr. Cato Mulligan at his last office visit at which time he also had swelling of his knuckles and blood work was sent off to exclude rheumatoid arthritis. Review of his labs indicate a normal CCP. He has been on OTC ibuprofen which does not help much. He complains of reduced range abduction of his left shoulder and occasional hand pain worse in the mornings with associated swelling. He also complains of right cough pain which occurs at night but denies claudication pains.  Outpatient Prescriptions Prior to Visit  Medication Sig Dispense Refill  . lisinopril (PRINIVIL,ZESTRIL) 20 MG tablet Take 1 tablet (20 mg total) by mouth daily. 90 tablet 3  . ibuprofen (ADVIL) 200 MG tablet Take 1 tablet (200 mg total) by mouth every 6 (six) hours as needed. 30 tablet 0  . predniSONE (DELTASONE) 20 MG tablet Take 3 tablets (60 mg total) by mouth daily. (Patient not taking: Reported on 03/29/2016) 12 tablet 0   No facility-administered medications prior to visit.    ROS Review of Systems  Constitutional: Negative for activity change and appetite change.  HENT: Negative for sinus pressure and sore throat.   Respiratory: Negative for chest tightness, shortness of breath and wheezing.   Cardiovascular: Negative for chest pain and palpitations.  Gastrointestinal: Negative for abdominal pain, constipation and abdominal distention.  Genitourinary: Negative.   Musculoskeletal:       See HPI  Psychiatric/Behavioral: Negative for behavioral problems and dysphoric mood.    Objective:  BP 164/125 mmHg  Pulse 90  Temp(Src) 98.1 F (36.7 C) (Oral)  Resp 18  Ht 5\' 9"  (1.753 m)  Wt 153 lb (69.4 kg)  BMI 22.58 kg/m2  SpO2 96%  BP/Weight 03/29/2016  03/08/2016 03/04/2016  Systolic BP 164 162 114  Diastolic BP 125 76 83  Wt. (Lbs) 153 153 153.13  BMI 22.58 22.58 22.6      Physical Exam  Constitutional: He is oriented to person, place, and time. He appears well-developed and well-nourished.  Cardiovascular: Normal rate, normal heart sounds and intact distal pulses.   No murmur heard. Pulmonary/Chest: Effort normal and breath sounds normal. He has no wheezes. He has no rales. He exhibits no tenderness.  Abdominal: Soft. Bowel sounds are normal. He exhibits no distension and no mass. There is no tenderness.  Musculoskeletal:  Mild enlargement of the proximal interphalangeal joints and presence of Bouchard's nodules Range of motion of left shoulder limited to 100; range of motion in right shoulder is normal.  Neurological: He is alert and oriented to person, place, and time.   CMP Latest Ref Rng 03/04/2016 04/30/2014 07/22/2013  Glucose 65 - 99 mg/dL 97 95 94  BUN 6 - 20 mg/dL 11 21 21   Creatinine 0.61 - 1.24 mg/dL 09/21/2013 ) 6.22)  Sodium 135 - 145 mmol/L 136 140 138  Potassium 3.5 - 5.1 mmol/L 4.1 4.0 3.9  Chloride 101 - 111 mmol/L 103 98 99  CO2 22 - 32 mmol/L 20(L) 25 24  Calcium 8.9 - 10.3 mg/dL 9.2 9.9 9.8  Total Protein 6.5 - 8.1 g/dL 8.1 8.3 7.4  Total Bilirubin 0.3 - 1.2 mg/dL 0.5 0.9 0.7  Alkaline Phos 38 - 126 U/L 66 75 62  AST 15 - 41 U/L  27 35 18  ALT 17 - 63 U/L 16(L) 15 12      Assessment & Plan:   1. Primary osteoarthritis involving multiple joints Labs negative for rheumatoid arthritis. Uncontrolled on ibuprofen If naproxen is not working I will add tramadol to regimen at his next visit - naproxen (NAPROSYN) 500 MG tablet; Take 1 tablet (500 mg total) by mouth 2 (two) times daily with a meal.  Dispense: 60 tablet; Refill: 2  2. Muscle spasm of calf - cyclobenzaprine (FLEXERIL) 10 MG tablet; Take 1 tablet (10 mg total) by mouth at bedtime. As needed  Dispense: 30 tablet; Refill: 0  3. Essential  hypertension Uncontrolled NSAID effect also contributory. Add amlodipine to regimen. Basic metabolic panel to evaluate for hyperkalemia given he is on lisinopril. - amLODipine (NORVASC) 10 MG tablet; Take 1 tablet (10 mg total) by mouth daily.  Dispense: 30 tablet; Refill: 3 - Basic Metabolic Panel   Meds ordered this encounter  Medications  . amLODipine (NORVASC) 10 MG tablet    Sig: Take 1 tablet (10 mg total) by mouth daily.    Dispense:  30 tablet    Refill:  3  . cyclobenzaprine (FLEXERIL) 10 MG tablet    Sig: Take 1 tablet (10 mg total) by mouth at bedtime. As needed    Dispense:  30 tablet    Refill:  0  . naproxen (NAPROSYN) 500 MG tablet    Sig: Take 1 tablet (500 mg total) by mouth 2 (two) times daily with a meal.    Dispense:  60 tablet    Refill:  2    Follow-up: Return in about 3 weeks (around 04/19/2016) for Follow-up of hypertension.   Jaclyn Shaggy MD

## 2016-03-30 LAB — BASIC METABOLIC PANEL
BUN: 10 mg/dL (ref 7–25)
CO2: 26 mmol/L (ref 20–31)
Calcium: 9.4 mg/dL (ref 8.6–10.3)
Chloride: 101 mmol/L (ref 98–110)
Creat: 1.01 mg/dL (ref 0.70–1.25)
GLUCOSE: 86 mg/dL (ref 65–99)
POTASSIUM: 4.3 mmol/L (ref 3.5–5.3)
SODIUM: 136 mmol/L (ref 135–146)

## 2016-04-03 ENCOUNTER — Telehealth: Payer: Self-pay

## 2016-04-03 NOTE — Telephone Encounter (Signed)
Contacted patient, patient verified name and DOB. Patient was given lab results verbalize understanding with no further questions. 

## 2016-04-03 NOTE — Telephone Encounter (Signed)
-----   Message from Jaclyn Shaggy, MD sent at 03/30/2016  9:07 AM EDT ----- Please inform the patient that labs are normal. Thank you.

## 2016-05-08 ENCOUNTER — Ambulatory Visit: Payer: Self-pay | Admitting: Family Medicine

## 2016-06-02 ENCOUNTER — Ambulatory Visit: Payer: Self-pay | Admitting: Family Medicine

## 2016-08-17 ENCOUNTER — Other Ambulatory Visit: Payer: Self-pay | Admitting: Family Medicine

## 2016-08-17 DIAGNOSIS — M159 Polyosteoarthritis, unspecified: Secondary | ICD-10-CM

## 2016-08-17 DIAGNOSIS — M150 Primary generalized (osteo)arthritis: Principal | ICD-10-CM

## 2017-10-17 ENCOUNTER — Ambulatory Visit: Payer: Self-pay | Attending: Nurse Practitioner | Admitting: Nurse Practitioner

## 2017-10-17 ENCOUNTER — Encounter: Payer: Self-pay | Admitting: Nurse Practitioner

## 2017-10-17 VITALS — BP 128/81 | HR 103 | Temp 98.1°F | Ht 70.0 in | Wt 155.6 lb

## 2017-10-17 DIAGNOSIS — I10 Essential (primary) hypertension: Secondary | ICD-10-CM

## 2017-10-17 DIAGNOSIS — M79642 Pain in left hand: Secondary | ICD-10-CM | POA: Insufficient documentation

## 2017-10-17 DIAGNOSIS — Z91018 Allergy to other foods: Secondary | ICD-10-CM | POA: Insufficient documentation

## 2017-10-17 DIAGNOSIS — Z888 Allergy status to other drugs, medicaments and biological substances status: Secondary | ICD-10-CM | POA: Insufficient documentation

## 2017-10-17 DIAGNOSIS — M7989 Other specified soft tissue disorders: Secondary | ICD-10-CM

## 2017-10-17 DIAGNOSIS — Z886 Allergy status to analgesic agent status: Secondary | ICD-10-CM | POA: Insufficient documentation

## 2017-10-17 DIAGNOSIS — M069 Rheumatoid arthritis, unspecified: Secondary | ICD-10-CM

## 2017-10-17 DIAGNOSIS — M79641 Pain in right hand: Secondary | ICD-10-CM | POA: Insufficient documentation

## 2017-10-17 MED ORDER — TRAMADOL HCL 50 MG PO TABS: 50.0000 mg | ORAL_TABLET | Freq: Three times a day (TID) | ORAL | 0 refills | Status: DC | PRN

## 2017-10-17 NOTE — Progress Notes (Signed)
Assessment & Plan:  Cory Curtis was seen today for establish care.  Diagnoses and all orders for this visit:  Bilateral hand swelling -     Uric Acid  Rheumatoid arthritis, involving unspecified site, unspecified rheumatoid factor presence (HCC) Ruled out for RA  Essential Hypertension Continue all antihypertensives as prescribed.  Remember to bring in your blood pressure log with you for your follow up appointment.  DASH/Mediterranean Diets are healthier choices for HTN.    Patient has been counseled on age-appropriate routine health concerns for screening and prevention. These are reviewed and up-to-date. Referrals have been placed accordingly. Immunizations are up-to-date or declined.    Subjective:   Chief Complaint  Patient presents with  . Establish Care    Patient stated that his hands, calf, and shoulder are in pain due to arthritis. Patient stated that he gets it during the day and night time. Patient would like medication refills.    HPI EUAL Curtis 61 y.o. male presents to office today with complaints of bilateral hand pain.   Hand Pain This is a chronic condition for the patient. He was ruled out for RA however continues with increased pain. Reports his hands are swollen constantly with pain 8-10/10. Aggravating factors: any hand movement. Descibes pain as stabbing and aching.  Onset a few years ago.  Has tried ibuprofen and naproxen. Unable to take prednisone due to extreme itching.  Relieving factors none: pain just comes and goes  Hypertension He has a history of hypertension however reports he has not taken any antihypertensives since last year. Will continue to monitor blood pressure closely. He denies chest pain, shortness of breath, lightheadedness, dizziness, palpitations or bilateral lower extremity edema.  BP Readings from Last 3 Encounters:  10/17/17 128/81  03/29/16 (!) 148/94  03/08/16 (!) 162/76    Review of Systems  Constitutional: Negative for  fever, malaise/fatigue and weight loss.  HENT: Negative.  Negative for nosebleeds.   Eyes: Negative.  Negative for blurred vision, double vision and photophobia.  Respiratory: Negative.  Negative for cough and shortness of breath.   Cardiovascular: Negative.  Negative for chest pain, palpitations and leg swelling.  Gastrointestinal: Negative.  Negative for abdominal pain, constipation, diarrhea, heartburn, nausea and vomiting.  Musculoskeletal: Negative.  Negative for myalgias.  Neurological: Negative.  Negative for dizziness, focal weakness, seizures and headaches.  Endo/Heme/Allergies: Negative for environmental allergies.  Psychiatric/Behavioral: Negative.  Negative for suicidal ideas.    Past Medical History:  Diagnosis Date  . Hypertension     History reviewed. No pertinent surgical history.  History reviewed. No pertinent family history.  Social History Reviewed with no changes to be made today.   Outpatient Medications Prior to Visit  Medication Sig Dispense Refill  . amLODipine (NORVASC) 10 MG tablet Take 1 tablet (10 mg total) by mouth daily. (Patient not taking: Reported on 10/17/2017) 30 tablet 3  . cyclobenzaprine (FLEXERIL) 10 MG tablet Take 1 tablet (10 mg total) by mouth at bedtime. As needed (Patient not taking: Reported on 10/17/2017) 30 tablet 0  . lisinopril (PRINIVIL,ZESTRIL) 20 MG tablet Take 1 tablet (20 mg total) by mouth daily. (Patient not taking: Reported on 10/17/2017) 90 tablet 3  . naproxen (NAPROSYN) 500 MG tablet TAKE 1 TABLET BY MOUTH TWICE DAILY WITH A MEAL (Patient not taking: Reported on 10/17/2017) 60 tablet 0   No facility-administered medications prior to visit.     Allergies  Allergen Reactions  . Tomato Swelling    Swelling of the face  .  Aspirin Other (See Comments)    "it's like I'm high and everything is moving slow"  . Predicort [Prednisolone] Hives       Objective:    BP 128/81 (BP Location: Left Arm, Patient Position: Sitting,  Cuff Size: Normal)   Pulse (!) 103   Temp 98.1 F (36.7 C) (Oral)   Ht 5\' 10"  (1.778 m)   Wt 155 lb 9.6 oz (70.6 kg)   SpO2 95%   BMI 22.33 kg/m  Wt Readings from Last 3 Encounters:  10/17/17 155 lb 9.6 oz (70.6 kg)  03/29/16 153 lb (69.4 kg)  03/08/16 153 lb (69.4 kg)    Physical Exam  Constitutional: He is oriented to person, place, and time. He appears well-developed and well-nourished. He is cooperative.  HENT:  Head: Normocephalic and atraumatic.  Eyes: EOM are normal.  Neck: Normal range of motion.  Cardiovascular: Normal rate, regular rhythm, normal heart sounds and intact distal pulses. Exam reveals no gallop and no friction rub.  No murmur heard. Pulmonary/Chest: Effort normal and breath sounds normal. No tachypnea. No respiratory distress. He has no decreased breath sounds. He has no wheezes. He has no rhonchi. He has no rales. He exhibits no tenderness.  Abdominal: Soft. Bowel sounds are normal.  Musculoskeletal: Normal range of motion. He exhibits no edema.       Right hand: He exhibits tenderness and swelling.       Left hand: He exhibits tenderness and swelling.  Neurological: He is alert and oriented to person, place, and time. Coordination normal.  Skin: Skin is warm and dry.  Psychiatric: He has a normal mood and affect. His behavior is normal. Judgment and thought content normal.  Nursing note and vitals reviewed.      Patient has been counseled extensively about nutrition and exercise as well as the importance of adherence with medications and regular follow-up. The patient was given clear instructions to go to ER or return to medical center if symptoms don't improve, worsen or new problems develop. The patient verbalized understanding.   Follow-up: Return in about 4 weeks (around 11/14/2017) for f/u hand swelling, Needs appointment with financial representative.11/16/2017, FNP-BC [facility] and [facility] [place],  [place]   10/20/2017, 9:18 AM

## 2017-10-18 LAB — URIC ACID: Uric Acid: 8.6 mg/dL (ref 3.7–8.6)

## 2017-10-19 ENCOUNTER — Other Ambulatory Visit: Payer: Self-pay | Admitting: Nurse Practitioner

## 2017-10-19 ENCOUNTER — Telehealth: Payer: Self-pay

## 2017-10-19 MED ORDER — TRAMADOL HCL 50 MG PO TABS: 50.0000 mg | ORAL_TABLET | Freq: Three times a day (TID) | ORAL | 0 refills | Status: DC | PRN

## 2017-10-19 NOTE — Telephone Encounter (Signed)
-----   Message from Claiborne Rigg, NP sent at 10/19/2017  9:13 AM EST ----- Please let patient know he does not have gout.

## 2017-10-20 ENCOUNTER — Encounter: Payer: Self-pay | Admitting: Nurse Practitioner

## 2017-10-22 NOTE — Telephone Encounter (Signed)
-----   Message from Zelda W Fleming, NP sent at 10/19/2017  9:13 AM EST ----- Please let patient know he does not have gout. 

## 2017-10-22 NOTE — Telephone Encounter (Signed)
Patient informed on lab result.   Patient verified DOB.  

## 2017-11-05 ENCOUNTER — Ambulatory Visit: Payer: Self-pay | Attending: Nurse Practitioner | Admitting: Nurse Practitioner

## 2017-11-05 ENCOUNTER — Encounter: Payer: Self-pay | Admitting: Nurse Practitioner

## 2017-11-05 VITALS — BP 145/84 | HR 87 | Temp 98.3°F | Ht 70.0 in | Wt 153.8 lb

## 2017-11-05 DIAGNOSIS — I10 Essential (primary) hypertension: Secondary | ICD-10-CM | POA: Insufficient documentation

## 2017-11-05 DIAGNOSIS — M159 Polyosteoarthritis, unspecified: Secondary | ICD-10-CM

## 2017-11-05 DIAGNOSIS — F17210 Nicotine dependence, cigarettes, uncomplicated: Secondary | ICD-10-CM | POA: Insufficient documentation

## 2017-11-05 DIAGNOSIS — Z1211 Encounter for screening for malignant neoplasm of colon: Secondary | ICD-10-CM | POA: Insufficient documentation

## 2017-11-05 DIAGNOSIS — Z0000 Encounter for general adult medical examination without abnormal findings: Secondary | ICD-10-CM

## 2017-11-05 DIAGNOSIS — M150 Primary generalized (osteo)arthritis: Secondary | ICD-10-CM

## 2017-11-05 DIAGNOSIS — M1991 Primary osteoarthritis, unspecified site: Secondary | ICD-10-CM | POA: Insufficient documentation

## 2017-11-05 MED ORDER — AMLODIPINE BESYLATE 10 MG PO TABS: 10.0000 mg | ORAL_TABLET | Freq: Every day | ORAL | 1 refills | Status: DC

## 2017-11-05 MED ORDER — NAPROXEN 500 MG PO TABS: 500.0000 mg | ORAL_TABLET | Freq: Two times a day (BID) | ORAL | 0 refills | Status: AC

## 2017-11-05 NOTE — Progress Notes (Signed)
Assessment & Plan:  Cory Curtis was seen today for follow-up.  Diagnoses and all orders for this visit:  Primary osteoarthritis involving multiple joints -     naproxen (NAPROSYN) 500 MG tablet; Take 1 tablet (500 mg total) by mouth 2 (two) times daily with a meal. May take in conjunction with tramadol for OA pain.  May need ABI at next office visit  Routine health maintenance -     CBC -     Basic metabolic panel -     Lipid panel  Colon cancer screening -     Ambulatory referral to Gastroenterology  Essential hypertension -     amLODipine (NORVASC) 10 MG tablet; Take 1 tablet (10 mg total) by mouth daily. Continue all antihypertensives as prescribed.  Remember to bring in your blood pressure log with you for your follow up appointment.  DASH/Mediterranean Diets are healthier choices for HTN.    Patient has been counseled on age-appropriate routine health concerns for screening and prevention. These are reviewed and up-to-date. Referrals have been placed accordingly. Immunizations are up-to-date or declined.    Subjective:   Chief Complaint  Patient presents with  . Follow-up    Patient stated his hand swelling has not gotten better and still comes with pain. Patient stated that both of his leg are in pain and a sharp needle pain when it goes numb.   HPI Cory Curtis 61 y.o. male presents to office today for follow up to OS. He was prescribed Tramadol at his last office visit for OA pain. Unfortunately due to miscommunication he did not take it in conjunction with his naprosyn. He reports somewhat better pain control with tramadol however still with some pain. He will still now take naprosyn and tramadol as adjunct to see if this will provide greater relief of his pain. Today he also reports experiencing ongoing sharp stabbing pain in the back of both of his legs. Pain occurs at rest and with activity. Relieving factors include rubbing or massaging his legs. Pain sometimes  wakes him up at night and can lasts for minutes to hours. He is still smoking 1/2 ppd but reports down from 1 and 1/2 ppd.  This could be a circulatory issue.   Essential Hypertension He has been out of his antihypertensives for over a year. Will refill today. He denies chest pain, shortness of breath, palpitations, lightheadedness, dizziness, headaches, or bilateral lower extremity edema.   Review of Systems  Constitutional: Negative for fever, malaise/fatigue and weight loss.  HENT: Negative.  Negative for nosebleeds.   Eyes: Negative.  Negative for blurred vision, double vision and photophobia.  Respiratory: Negative.  Negative for cough and shortness of breath.   Cardiovascular: Negative.  Negative for chest pain, palpitations and leg swelling.  Gastrointestinal: Negative.  Negative for abdominal pain, constipation, diarrhea, heartburn, nausea and vomiting.  Musculoskeletal: Positive for joint pain and myalgias.  Neurological: Positive for sensory change. Negative for dizziness, focal weakness, seizures and headaches.  Endo/Heme/Allergies: Negative for environmental allergies.  Psychiatric/Behavioral: Negative.  Negative for suicidal ideas.    Past Medical History:  Diagnosis Date  . Hypertension     History reviewed. No pertinent surgical history.  History reviewed. No pertinent family history.  Social History Reviewed with no changes to be made today.   Outpatient Medications Prior to Visit  Medication Sig Dispense Refill  . traMADol (ULTRAM) 50 MG tablet Take 1 tablet (50 mg total) by mouth every 8 (eight) hours as needed. 30  tablet 0   No facility-administered medications prior to visit.     Allergies  Allergen Reactions  . Tomato Swelling    Swelling of the face  . Aspirin Other (See Comments)    "it's like I'm high and everything is moving slow"  . Predicort [Prednisolone] Hives       Objective:    BP (!) 145/84 (BP Location: Left Arm, Patient Position:  Sitting, Cuff Size: Normal)   Pulse 87   Temp 98.3 F (36.8 C) (Oral)   Ht 5\' 10"  (1.778 m)   Wt 153 lb 12.8 oz (69.8 kg)   SpO2 100%   BMI 22.07 kg/m  Wt Readings from Last 3 Encounters:  11/05/17 153 lb 12.8 oz (69.8 kg)  10/17/17 155 lb 9.6 oz (70.6 kg)  03/29/16 153 lb (69.4 kg)    Physical Exam  Constitutional: He is oriented to person, place, and time. He appears well-developed and well-nourished. He is cooperative.  HENT:  Head: Normocephalic and atraumatic.  Eyes: EOM are normal.  Neck: Normal range of motion.  Cardiovascular: Normal rate, regular rhythm, normal heart sounds and intact distal pulses. Exam reveals no gallop and no friction rub.  No murmur heard. Pulmonary/Chest: Effort normal and breath sounds normal. No tachypnea. No respiratory distress. He has no decreased breath sounds. He has no wheezes. He has no rhonchi. He has no rales. He exhibits no tenderness.  Abdominal: Soft. Bowel sounds are normal.  Musculoskeletal: Normal range of motion. He exhibits no edema.       Right hand: He exhibits swelling (joint swelling).       Left hand: He exhibits swelling (joint swelling).       Right lower leg: He exhibits no tenderness, no bony tenderness, no swelling and no edema.       Left lower leg: He exhibits no tenderness, no bony tenderness, no swelling and no edema.  Neurological: He is alert and oriented to person, place, and time. Coordination normal.  Skin: Skin is warm and dry.  Psychiatric: He has a normal mood and affect. His behavior is normal. Judgment and thought content normal.  Nursing note and vitals reviewed.     Patient has been counseled extensively about nutrition and exercise as well as the importance of adherence with medications and regular follow-up. The patient was given clear instructions to go to ER or return to medical center if symptoms don't improve, worsen or new problems develop. The patient verbalized understanding.   Follow-up: Return  in about 3 weeks (around 11/26/2017) for BP recheck and OA pain.   01/24/2018, FNP-BC [facility] [place], [place]   11/05/2017, 12:35 PM

## 2017-11-06 LAB — LIPID PANEL
CHOL/HDL RATIO: 2.7 ratio (ref 0.0–5.0)
Cholesterol, Total: 210 mg/dL — ABNORMAL HIGH (ref 100–199)
HDL: 77 mg/dL (ref >39)
LDL CALC: 115 mg/dL — AB (ref 0–99)
TRIGLYCERIDES: 91 mg/dL (ref 0–149)
VLDL Cholesterol Cal: 18 mg/dL (ref 5–40)

## 2017-11-06 LAB — BASIC METABOLIC PANEL
BUN/Creatinine Ratio: 13 (ref 10–24)
BUN: 14 mg/dL (ref 8–27)
CALCIUM: 10.2 mg/dL (ref 8.6–10.2)
CO2: 26 mmol/L (ref 20–29)
CREATININE: 1.07 mg/dL (ref 0.76–1.27)
Chloride: 101 mmol/L (ref 96–106)
GFR, EST AFRICAN AMERICAN: 86 mL/min/{1.73_m2} (ref >59)
GFR, EST NON AFRICAN AMERICAN: 74 mL/min/{1.73_m2} (ref >59)
Glucose: 87 mg/dL (ref 65–99)
POTASSIUM: 4.7 mmol/L (ref 3.5–5.2)
SODIUM: 141 mmol/L (ref 134–144)

## 2017-11-06 LAB — CBC
Hematocrit: 46.2 % (ref 37.5–51.0)
Hemoglobin: 15.9 g/dL (ref 13.0–17.7)
MCH: 30.7 pg (ref 26.6–33.0)
MCHC: 34.4 g/dL (ref 31.5–35.7)
MCV: 89 fL (ref 79–97)
PLATELETS: 296 10*3/uL (ref 150–379)
RBC: 5.18 x10E6/uL (ref 4.14–5.80)
RDW: 15.4 % (ref 12.3–15.4)
WBC: 5.4 10*3/uL (ref 3.4–10.8)

## 2017-11-21 ENCOUNTER — Telehealth: Payer: Self-pay

## 2017-11-21 NOTE — Telephone Encounter (Signed)
Patient informed on lab result and advice from the PCP.    Patient verified DOB.

## 2017-11-21 NOTE — Telephone Encounter (Signed)
-----   Message from Claiborne Rigg, NP sent at 11/20/2017  9:11 PM EST ----- Labs are normal except for lipid panel. Work on eating a low fat, heart healthy diet and participate in a  regular aerobic exercise program to control as well.

## 2017-11-26 ENCOUNTER — Ambulatory Visit: Payer: Self-pay | Attending: Nurse Practitioner | Admitting: *Deleted

## 2017-11-26 ENCOUNTER — Telehealth: Payer: Self-pay | Admitting: *Deleted

## 2017-11-26 VITALS — BP 169/90 | HR 83 | Temp 98.0°F | Resp 18 | Ht 69.0 in | Wt 156.0 lb

## 2017-11-26 DIAGNOSIS — I10 Essential (primary) hypertension: Secondary | ICD-10-CM | POA: Insufficient documentation

## 2017-11-26 NOTE — Telephone Encounter (Signed)
Patient verified DOB MA contacted patient because he left the office without completing his nurse visit for BP. Patient states he was hungry and left. Patient is scheduled for 12/07/17 for BP follow up with PCP. No further questions at this time.

## 2017-11-26 NOTE — Patient Instructions (Signed)
Patient left without being instructed.

## 2017-12-05 ENCOUNTER — Encounter: Payer: Self-pay | Admitting: Nurse Practitioner

## 2017-12-07 ENCOUNTER — Ambulatory Visit: Payer: Self-pay | Admitting: Nurse Practitioner

## 2018-01-31 ENCOUNTER — Other Ambulatory Visit: Payer: Self-pay

## 2018-01-31 ENCOUNTER — Emergency Department (HOSPITAL_COMMUNITY)
Admission: EM | Admit: 2018-01-31 | Discharge: 2018-01-31 | Disposition: A | Discharge status: Home / Self Care | Payer: Self-pay | Attending: Emergency Medicine | Admitting: Emergency Medicine

## 2018-01-31 ENCOUNTER — Encounter (HOSPITAL_COMMUNITY): Payer: Self-pay | Admitting: Emergency Medicine

## 2018-01-31 ENCOUNTER — Emergency Department (HOSPITAL_COMMUNITY): Payer: Self-pay

## 2018-01-31 DIAGNOSIS — F17210 Nicotine dependence, cigarettes, uncomplicated: Secondary | ICD-10-CM | POA: Insufficient documentation

## 2018-01-31 DIAGNOSIS — M79604 Pain in right leg: Secondary | ICD-10-CM | POA: Insufficient documentation

## 2018-01-31 DIAGNOSIS — Z79899 Other long term (current) drug therapy: Secondary | ICD-10-CM | POA: Insufficient documentation

## 2018-01-31 DIAGNOSIS — I10 Essential (primary) hypertension: Secondary | ICD-10-CM | POA: Insufficient documentation

## 2018-01-31 MED ORDER — ACETAMINOPHEN 325 MG PO TABS
650.0000 mg | ORAL_TABLET | Freq: Once | ORAL | Status: AC
  Administered 2018-01-31: 650 mg via ORAL
  Filled 2018-01-31: qty 2

## 2018-01-31 MED ORDER — TRAMADOL HCL 50 MG PO TABS: 50.0000 mg | ORAL_TABLET | Freq: Four times a day (QID) | ORAL | 0 refills | Status: DC | PRN

## 2018-01-31 NOTE — ED Triage Notes (Signed)
Patient complains of pain in his right lower leg, states the pain is in the location of where he had a pin inserted as a child for traction after a fracture. No swelling or discoloration at location of pain. Patient in no apparent distress at this time and has no other complaints.

## 2018-01-31 NOTE — ED Notes (Addendum)
Right leg pain. PT reports he was in contractions for about 2 years. He states it hurts where "they put pulled the contraction pipe"
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
## 2018-01-31 NOTE — ED Notes (Signed)
Pt back from X-Ray.  

## 2018-01-31 NOTE — ED Notes (Signed)
D/C reviewed with patient with teach back.

## 2018-01-31 NOTE — Discharge Instructions (Signed)
Please read attached information. If you experience any new or worsening signs or symptoms please return to the emergency room for evaluation. Please follow-up with your primary care provider or specialist as discussed. Please use medication prescribed only as directed and discontinue taking if you have any concerning signs or symptoms.   °

## 2018-02-05 ENCOUNTER — Encounter: Payer: Self-pay | Admitting: Nurse Practitioner

## 2018-02-05 ENCOUNTER — Ambulatory Visit: Payer: Self-pay | Attending: Nurse Practitioner | Admitting: Nurse Practitioner

## 2018-02-05 VITALS — BP 132/88 | HR 86 | Temp 98.3°F | Ht 70.0 in | Wt 140.8 lb

## 2018-02-05 DIAGNOSIS — M150 Primary generalized (osteo)arthritis: Secondary | ICD-10-CM

## 2018-02-05 DIAGNOSIS — Z0000 Encounter for general adult medical examination without abnormal findings: Secondary | ICD-10-CM

## 2018-02-05 DIAGNOSIS — M1991 Primary osteoarthritis, unspecified site: Secondary | ICD-10-CM | POA: Insufficient documentation

## 2018-02-05 DIAGNOSIS — M79604 Pain in right leg: Secondary | ICD-10-CM | POA: Insufficient documentation

## 2018-02-05 DIAGNOSIS — M159 Polyosteoarthritis, unspecified: Secondary | ICD-10-CM

## 2018-02-05 DIAGNOSIS — I10 Essential (primary) hypertension: Secondary | ICD-10-CM | POA: Insufficient documentation

## 2018-02-05 DIAGNOSIS — Z886 Allergy status to analgesic agent status: Secondary | ICD-10-CM | POA: Insufficient documentation

## 2018-02-05 DIAGNOSIS — Z79891 Long term (current) use of opiate analgesic: Secondary | ICD-10-CM | POA: Insufficient documentation

## 2018-02-05 DIAGNOSIS — Z79899 Other long term (current) drug therapy: Secondary | ICD-10-CM | POA: Insufficient documentation

## 2018-02-05 DIAGNOSIS — M069 Rheumatoid arthritis, unspecified: Secondary | ICD-10-CM | POA: Insufficient documentation

## 2018-02-05 MED ORDER — AMLODIPINE BESYLATE 10 MG PO TABS: 10.0000 mg | ORAL_TABLET | Freq: Every day | ORAL | 1 refills | Status: DC

## 2018-02-05 MED ORDER — TRAMADOL HCL 50 MG PO TABS: 50.0000 mg | ORAL_TABLET | Freq: Four times a day (QID) | ORAL | 0 refills | Status: AC | PRN

## 2018-02-05 NOTE — Patient Instructions (Addendum)
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
## 2018-02-06 LAB — HIV ANTIBODY (ROUTINE TESTING W REFLEX): HIV Screen 4th Generation wRfx: NONREACTIVE

## 2018-02-06 LAB — HEPATITIS C ANTIBODY: Hep C Virus Ab: <0.1 s/co ratio (ref 0.0–0.9)

## 2018-02-08 ENCOUNTER — Telehealth: Payer: Self-pay

## 2018-02-08 NOTE — Progress Notes (Signed)
Patient stated the pain medication he is taking is not helping him for his leg pain.  Pt. Understand that he was told to take 1 tablet every 6 hours when he needed, but he been taking 2 or 3 of Tramadol and it seems to help him with the pain.  Pt stated he would like to see Ms. Fleming earlier than his scheduled appt. For it.  CMA informed patient to call back next week to see if there are any openings.   Patient understood.

## 2018-02-08 NOTE — Telephone Encounter (Signed)
-----   Message from Claiborne Rigg, NP sent at 02/07/2018 11:35 PM EDT ----- Tests were negative for HIV and hepatitis.

## 2018-02-08 NOTE — Telephone Encounter (Signed)
-----   Message from Zelda W Fleming, NP sent at 02/07/2018 11:35 PM EDT ----- Tests were negative for HIV and hepatitis. 

## 2018-02-08 NOTE — Telephone Encounter (Signed)
CMA spoke to patient to inform on lab results.  Patient understood.  

## 2018-02-10 LAB — TOXASSURE SELECT 13 (MW), URINE

## 2018-02-26 ENCOUNTER — Ambulatory Visit: Payer: Self-pay | Admitting: Nurse Practitioner

## 2018-03-12 ENCOUNTER — Encounter: Payer: Self-pay | Admitting: Nurse Practitioner

## 2018-03-12 ENCOUNTER — Ambulatory Visit: Payer: Self-pay | Attending: Nurse Practitioner | Admitting: Nurse Practitioner

## 2018-03-12 VITALS — BP 142/96 | HR 96 | Temp 98.3°F | Ht 70.0 in | Wt 151.4 lb

## 2018-03-12 DIAGNOSIS — I10 Essential (primary) hypertension: Secondary | ICD-10-CM | POA: Insufficient documentation

## 2018-03-12 DIAGNOSIS — M159 Polyosteoarthritis, unspecified: Secondary | ICD-10-CM

## 2018-03-12 DIAGNOSIS — M1991 Primary osteoarthritis, unspecified site: Secondary | ICD-10-CM | POA: Insufficient documentation

## 2018-03-12 DIAGNOSIS — M150 Primary generalized (osteo)arthritis: Secondary | ICD-10-CM

## 2018-03-12 DIAGNOSIS — Z79899 Other long term (current) drug therapy: Secondary | ICD-10-CM | POA: Insufficient documentation

## 2018-03-12 DIAGNOSIS — Z886 Allergy status to analgesic agent status: Secondary | ICD-10-CM | POA: Insufficient documentation

## 2018-03-12 DIAGNOSIS — M25562 Pain in left knee: Secondary | ICD-10-CM | POA: Insufficient documentation

## 2018-03-12 DIAGNOSIS — M7989 Other specified soft tissue disorders: Secondary | ICD-10-CM | POA: Insufficient documentation

## 2018-03-12 MED ORDER — NAPROXEN 500 MG PO TABS: 500.0000 mg | ORAL_TABLET | Freq: Two times a day (BID) | ORAL | 1 refills | Status: DC
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
## 2018-03-12 NOTE — Patient Instructions (Signed)
Osteoarthritis Osteoarthritis is a type of arthritis that affects tissue that covers the ends of bones in joints (cartilage). Cartilage acts as a cushion between the bones and helps them move smoothly. Osteoarthritis results when cartilage in the joints gets worn down. Osteoarthritis is sometimes called "wear and tear" arthritis. Osteoarthritis is the most common form of arthritis. It often occurs in older people. It is a condition that gets worse over time (a progressive condition). Joints that are most often affected by this condition are in:  Fingers.  Toes.  Hips.  Knees.  Spine, including neck and lower back.  What are the causes? This condition is caused by age-related wearing down of cartilage that covers the ends of bones. What increases the risk? The following factors may make you more likely to develop this condition:  Older age.  Being overweight or obese.  Overuse of joints, such as in athletes.  Past injury of a joint.  Past surgery on a joint.  Family history of osteoarthritis.  What are the signs or symptoms? The main symptoms of this condition are pain, swelling, and stiffness in the joint. The joint may lose its shape over time. Small pieces of bone or cartilage may break off and float inside of the joint, which may cause more pain and damage to the joint. Small deposits of bone (osteophytes) may grow on the edges of the joint. Other symptoms may include:  A grating or scraping feeling inside the joint when you move it.  Popping or creaking sounds when you move.  Symptoms may affect one or more joints. Osteoarthritis in a major joint, such as your knee or hip, can make it painful to walk or exercise. If you have osteoarthritis in your hands, you might not be able to grip items, twist your hand, or control small movements of your hands and fingers (fine motor skills). How is this diagnosed? This condition may be diagnosed based on:  Your medical history.  A  physical exam.  Your symptoms.  X-rays of the affected joint(s).  Blood tests to rule out other types of arthritis.  How is this treated? There is no cure for this condition, but treatment can help to control pain and improve joint function. Treatment plans may include:  A prescribed exercise program that allows for rest and joint relief. You may work with a physical therapist.  A weight control plan.  Pain relief techniques, such as: ? Applying heat and cold to the joint. ? Electric pulses delivered to nerve endings under the skin (transcutaneous electrical nerve stimulation, or TENS). ? Massage. ? Certain nutritional supplements.  NSAIDs or prescription medicines to help relieve pain.  Medicine to help relieve pain and inflammation (corticosteroids). This can be given by mouth (orally) or as an injection.  Assistive devices, such as a brace, wrap, splint, specialized glove, or cane.  Surgery, such as: ? An osteotomy. This is done to reposition the bones and relieve pain or to remove loose pieces of bone and cartilage. ? Joint replacement surgery. You may need this surgery if you have very bad (advanced) osteoarthritis.  Follow these instructions at home: Activity  Rest your affected joints as directed by your health care provider.  Do not drive or use heavy machinery while taking prescription pain medicine.  Exercise as directed. Your health care provider or physical therapist may recommend specific types of exercise, such as: ? Strengthening exercises. These are done to strengthen the muscles that support joints that are affected by arthritis.   They can be performed with weights or with exercise bands to add resistance. ? Aerobic activities. These are exercises, such as brisk walking or water aerobics, that get your heart pumping. ? Range-of-motion activities. These keep your joints easy to move. ? Balance and agility exercises. Managing pain, stiffness, and  swelling  If directed, apply heat to the affected area as often as told by your health care provider. Use the heat source that your health care provider recommends, such as a moist heat pack or a heating pad. ? If you have a removable assistive device, remove it as told by your health care provider. ? Place a towel between your skin and the heat source. If your health care provider tells you to keep the assistive device on while you apply heat, place a towel between the assistive device and the heat source. ? Leave the heat on for 20-30 minutes. ? Remove the heat if your skin turns bright red. This is especially important if you are unable to feel pain, heat, or cold. You may have a greater risk of getting burned.  If directed, put ice on the affected joint: ? If you have a removable assistive device, remove it as told by your health care provider. ? Put ice in a plastic bag. ? Place a towel between your skin and the bag. If your health care provider tells you to keep the assistive device on during icing, place a towel between the assistive device and the bag. ? Leave the ice on for 20 minutes, 2-3 times a day. General instructions  Take over-the-counter and prescription medicines only as told by your health care provider.  Maintain a healthy weight. Follow instructions from your health care provider for weight control. These may include dietary restrictions.  Do not use any products that contain nicotine or tobacco, such as cigarettes and e-cigarettes. These can delay bone healing. If you need help quitting, ask your health care provider.  Use assistive devices as directed by your health care provider.  Keep all follow-up visits as told by your health care provider. This is important. Where to find more information:  [employer] of Arthritis and Musculoskeletal and Skin Diseases: www.niams.http://www.myers.net/  [employer] on Aging: https://walker.com/  American College of Rheumatology:  www.rheumatology.org Contact a health care provider if:  Your skin turns red.  You develop a rash.  You have pain that gets worse.  You have a fever along with joint or muscle aches. Get help right away if:  You lose a lot of weight.  You suddenly lose your appetite.  You have night sweats. Summary  Osteoarthritis is a type of arthritis that affects tissue covering the ends of bones in joints (cartilage).  This condition is caused by age-related wearing down of cartilage that covers the ends of bones.  The main symptom of this condition is pain, swelling, and stiffness in the joint.  There is no cure for this condition, but treatment can help to control pain and improve joint function. This information is not intended to replace advice given to you by your health care provider. Make sure you discuss any questions you have with your health care provider. Document Released: 11/06/2005 Document Revised: 07/10/2016 Document Reviewed: 07/10/2016 Elsevier Interactive Patient Education  2018 [employer]  Arthritis Arthritis means joint pain. It can also mean joint disease. A joint is a place where bones come together. People who have arthritis may have:  Red joints.  Swollen joints.  Stiff joints.  Warm  joints.  A fever.  A feeling of being sick.  Follow these instructions at home: Pay attention to any changes in your symptoms. Take these actions to help with your pain and swelling. Medicines  Take over-the-counter and prescription medicines only as told by your doctor.  Do not take aspirin for pain if your doctor says that you may have gout. Activity  Rest your joint if your doctor tells you to.  Avoid activities that make the pain worse.  Exercise your joint regularly as told by your doctor. Try doing exercises like: ? Swimming. ? Water aerobics. ? Biking. ? Walking. Joint Care   If your joint is swollen, keep it raised (elevated) if told by your  doctor.  If your joint feels stiff in the morning, try taking a warm shower.  If you have diabetes, do not apply heat without asking your doctor.  If told, apply heat to the joint: ? Put a towel between the joint and the hot pack or heating pad. ? Leave the heat on the area for 20-30 minutes.  If told, apply ice to the joint: ? Put ice in a plastic bag. ? Place a towel between your skin and the bag. ? Leave the ice on for 20 minutes, 2-3 times per day.  Keep all follow-up visits as told by your doctor. Contact a doctor if:  The pain gets worse.  You have a fever. Get help right away if:  You have very bad pain in your joint.  You have swelling in your joint.  Your joint is red.  Many joints become painful and swollen.  You have very bad back pain.  Your leg is very weak.  You cannot control your pee (urine) or poop (stool). This information is not intended to replace advice given to you by your health care provider. Make sure you discuss any questions you have with your health care provider. Document Released: 01/31/2010 Document Revised: 04/13/2016 Document Reviewed: 02/01/2015 Elsevier Interactive Patient Education  [employer].

## 2018-04-30 ENCOUNTER — Ambulatory Visit: Payer: Self-pay | Admitting: Nurse Practitioner

## 2019-07-26 ENCOUNTER — Emergency Department (HOSPITAL_COMMUNITY)
Admission: EM | Admit: 2019-07-26 | Discharge: 2019-07-26 | Disposition: A | Discharge status: Home / Self Care | Payer: Self-pay | Attending: Emergency Medicine | Admitting: Emergency Medicine

## 2019-07-26 ENCOUNTER — Emergency Department (HOSPITAL_COMMUNITY): Payer: Self-pay

## 2019-07-26 DIAGNOSIS — Y9389 Activity, other specified: Secondary | ICD-10-CM | POA: Insufficient documentation

## 2019-07-26 DIAGNOSIS — Y999 Unspecified external cause status: Secondary | ICD-10-CM | POA: Insufficient documentation

## 2019-07-26 DIAGNOSIS — F17210 Nicotine dependence, cigarettes, uncomplicated: Secondary | ICD-10-CM | POA: Insufficient documentation

## 2019-07-26 DIAGNOSIS — Y92007 Garden or yard of unspecified non-institutional (private) residence as the place of occurrence of the external cause: Secondary | ICD-10-CM | POA: Insufficient documentation

## 2019-07-26 DIAGNOSIS — X509XXA Other and unspecified overexertion or strenuous movements or postures, initial encounter: Secondary | ICD-10-CM | POA: Insufficient documentation

## 2019-07-26 DIAGNOSIS — S93401A Sprain of unspecified ligament of right ankle, initial encounter: Secondary | ICD-10-CM | POA: Insufficient documentation

## 2019-07-26 DIAGNOSIS — Z79899 Other long term (current) drug therapy: Secondary | ICD-10-CM | POA: Insufficient documentation

## 2019-07-26 DIAGNOSIS — I10 Essential (primary) hypertension: Secondary | ICD-10-CM | POA: Insufficient documentation

## 2019-07-26 MED ORDER — NAPROXEN 250 MG PO TABS
500.0000 mg | ORAL_TABLET | Freq: Once | ORAL | Status: AC
  Administered 2019-07-26: 500 mg via ORAL
  Filled 2019-07-26: qty 2

## 2019-07-26 NOTE — ED Triage Notes (Signed)
Pt to ER for evaluation of right ankle pain and swelling for 2 days. No reported injury. Unable to bear weight. Utilizing crutches at present time.

## 2019-07-26 NOTE — Discharge Instructions (Signed)
Your caregiver has diagnosed you as suffering from an ankle sprain. Ankle sprain occurs when the ligaments that hold the ankle joint together are stretched or torn. It may take 4 to 6 weeks to heal. ° °-Follow-up: Call orthopedic follow up today today or tomorrow to schedule followup appointment for recheck of ongoing ankle pain that can be canceled with a 24-48 hour notice if complete resolution of pain. ° °For Activity: Use crutches with non-weight bearing for the first few days. Then, you may walk on your ankle as the pain allows, or as instructed. Start gradually with weight bearing on the affected ankle. Once you can walk pain free, then try jogging. When you can run forwards, then you can try moving side-to-side. If you cannot walk without crutches in one week, you need a re-check. °SEEK IMMEDIATE MEDICAL ATTENTION IF: your toes are numb or tingling, appear gray or blue, or you have severe pain (also elevate leg and loosen splint). ° °

## 2019-07-26 NOTE — ED Notes (Signed)
Patient verbalized understanding of discharge instructions and denies any further needs or questions at this time. VS stable. Patient ambulatory with steady gait using crutches.

## 2019-07-26 NOTE — ED Provider Notes (Signed)
[facility] EMERGENCY DEPARTMENT Provider Note   CSN: [id number] Arrival date & time: 07/26/19  6195     History   Chief Complaint Chief Complaint  Patient presents with  . Ankle Pain    HPI Cory Curtis is a 63 y.o. male past medical history of hypertension presents emergency department today with chief complaint of ankle pain x2 days.  Pain is located in his medial malleolus of right ankle.  He is unsure if he had any injury of the ankle.  He does report he was working outside a lot before it happened and he could have potentially twisted the ankle wrong.  He has been using crutches as he is unable to bear weight.  He has not taken anything for pain prior to arrival.  Denies history of previous ankle injury or recent fall.  Denies fever, chills, numbness, weakness.  Patient is not anticoagulated. History provided by patient with additional history obtained from chart review.       Past Medical History:  Diagnosis Date  . Hypertension     Patient Active Problem List   Diagnosis Date Noted  . Essential hypertension 03/08/2016  . Osteoarthritis 03/08/2016    Past Surgical History:  Procedure Laterality Date  . LEG SURGERY          Home Medications    Prior to Admission medications   Medication Sig Start Date End Date Taking? Authorizing Provider  amLODipine (NORVASC) 10 MG tablet Take 1 tablet (10 mg total) by mouth daily. 02/05/18 05/06/18  Gildardo Pounds, NP  naproxen (NAPROSYN) 500 MG tablet Take 1 tablet (500 mg total) by mouth 2 (two) times daily with a meal. 03/12/18   Gildardo Pounds, NP    Family History Family History  Problem Relation Age of Onset  . Cancer Brother   . Cancer Daughter     Social History Social History   Tobacco Use  . Smoking status: Current Every Day Smoker    Packs/day: 0.50    Types: Cigarettes  . Smokeless tobacco: Never Used  . Tobacco comment: down to 1 pack every 4 days  Substance Use Topics  .  Alcohol use: Yes    Alcohol/week: 5.0 standard drinks    Types: 5 Cans of beer per week  . Drug use: No     Allergies   Tomato, Aspirin, and Predicort [prednisolone]   Review of Systems Review of Systems  Constitutional: Negative for chills and fever.  Musculoskeletal: Positive for arthralgias and joint swelling. Negative for back pain and neck pain.  Skin: Negative for wound.  Neurological: Negative for weakness and numbness.     Physical Exam Updated Vital Signs BP (!) 155/99 (BP Location: Left Arm)   Pulse 79   Temp 98.6 F (37 C) (Oral)   Resp 16   SpO2 97%   Physical Exam Vitals signs and nursing note reviewed.  Constitutional:      Appearance: He is well-developed. He is not ill-appearing or toxic-appearing.  HENT:     Head: Normocephalic and atraumatic.     Nose: Nose normal.  Eyes:     General: No scleral icterus.       Right eye: No discharge.        Left eye: No discharge.     Conjunctiva/sclera: Conjunctivae normal.  Neck:     Musculoskeletal: Normal range of motion.     Vascular: No JVD.  Cardiovascular:     Rate and Rhythm: Normal rate  and regular rhythm.     Pulses: Normal pulses.          Radial pulses are 2+ on the right side and 2+ on the left side.       Dorsalis pedis pulses are 2+ on the right side and 2+ on the left side.     Heart sounds: Normal heart sounds.  Pulmonary:     Effort: Pulmonary effort is normal.     Breath sounds: Normal breath sounds.  Abdominal:     General: There is no distension.  Musculoskeletal: Normal range of motion.     Right lower leg: No edema.     Left lower leg: No edema.     Comments: There is swelling and tenderness over the medial malleolus. No overt deformity. No tenderness over the lateral aspect of the ankle. The fifth metatarsal is not tender. The ankle joint is intact without excessive opening on stressing. No break in skin. Good pedal pulse and cap refill of all toes. Wiggling toes without difficulty.  Neurovascularly intact distally. Compartments soft above and below affected joint.    Right knee is normal. Full ROM, no tenderness to palpation.    Skin:    General: Skin is warm and dry.  Neurological:     Mental Status: He is oriented to person, place, and time.     GCS: GCS eye subscore is 4. GCS verbal subscore is 5. GCS motor subscore is 6.     Comments: Fluent speech, no facial droop.  Psychiatric:        Behavior: Behavior normal.      ED Treatments / Results  Labs (all labs ordered are listed, but only abnormal results are displayed) Labs Reviewed - No data to display  EKG None  Radiology Dg Ankle Complete Right  Result Date: 07/26/2019 CLINICAL DATA:  Ankle pain and swelling.  No reported injury. EXAM: RIGHT ANKLE - COMPLETE 3+ VIEW COMPARISON:  None. FINDINGS: There is medial soft tissue swelling. No underlying fracture or dislocation identified. No radio-opaque foreign body or soft tissue calcification. IMPRESSION: 1. Medial soft tissue swelling. 2. No acute abnormality. Electronically Signed   By: Signa Kell M.D.   On: 07/26/2019 10:40    Procedures Procedures (including critical care time)  Medications Ordered in ED Medications  naproxen (NAPROSYN) tablet 500 mg (500 mg Oral Given 07/26/19 1103)     Initial Impression / Assessment and Plan / ED Course  I have reviewed the triage vital signs and the nursing notes.  Pertinent labs & imaging results that were available during my care of the patient were reviewed by me and considered in my medical decision making (see chart for details).  Patient presents to the ED with complaints of pain to the right ankle after possible stepping wrong. Exam without obvious deformity or open wounds. ROM intact. Tender to palpation over medial malleolus. NVI distally. Xray negative for fracture/dislocation. Therapeutic splint provided. PRICE and motrin recommended. I discussed results, treatment plan, need for follow-up, and  return precautions with the patient. Provided opportunity for questions, patient confirmed understanding and are in agreement with plan.   This note was prepared using Dragon voice recognition software and may include unintentional dictation errors due to the inherent limitations of voice recognition software.   Final Clinical Impressions(s) / ED Diagnoses   Final diagnoses:  Sprain of right ankle, unspecified ligament, initial encounter    ED Discharge Orders    None       Jazzlin Clements,  Caroleen Hamman, PA-C 07/26/19 2044    Margarita Grizzle, MD 07/27/19 [phone]

## 2020-10-09 ENCOUNTER — Emergency Department (HOSPITAL_COMMUNITY): Payer: No Typology Code available for payment source

## 2020-10-09 ENCOUNTER — Other Ambulatory Visit: Payer: Self-pay

## 2020-10-09 ENCOUNTER — Encounter (HOSPITAL_COMMUNITY): Payer: Self-pay | Admitting: *Deleted

## 2020-10-09 ENCOUNTER — Emergency Department (HOSPITAL_COMMUNITY)
Admission: EM | Admit: 2020-10-09 | Discharge: 2020-10-09 | Disposition: A | Discharge status: Home / Self Care | Payer: No Typology Code available for payment source | Attending: Emergency Medicine | Admitting: Emergency Medicine

## 2020-10-09 DIAGNOSIS — Z79899 Other long term (current) drug therapy: Secondary | ICD-10-CM | POA: Diagnosis not present

## 2020-10-09 DIAGNOSIS — I10 Essential (primary) hypertension: Secondary | ICD-10-CM | POA: Diagnosis not present

## 2020-10-09 DIAGNOSIS — M546 Pain in thoracic spine: Secondary | ICD-10-CM | POA: Diagnosis not present

## 2020-10-09 DIAGNOSIS — R0789 Other chest pain: Secondary | ICD-10-CM | POA: Diagnosis present

## 2020-10-09 DIAGNOSIS — R109 Unspecified abdominal pain: Secondary | ICD-10-CM | POA: Insufficient documentation

## 2020-10-09 DIAGNOSIS — F17210 Nicotine dependence, cigarettes, uncomplicated: Secondary | ICD-10-CM | POA: Insufficient documentation

## 2020-10-09 LAB — CBC WITH DIFFERENTIAL/PLATELET
Abs Immature Granulocytes: 0.02 10*3/uL (ref 0.00–0.07)
Basophils Absolute: 0 10*3/uL (ref 0.0–0.1)
Basophils Relative: 1 %
Eosinophils Absolute: 0 10*3/uL (ref 0.0–0.5)
Eosinophils Relative: 1 %
HCT: 47.1 % (ref 39.0–52.0)
Hemoglobin: 15.7 g/dL (ref 13.0–17.0)
Immature Granulocytes: 0 %
Lymphocytes Relative: 34 %
Lymphs Abs: 2.2 10*3/uL (ref 0.7–4.0)
MCH: 31 pg (ref 26.0–34.0)
MCHC: 33.3 g/dL (ref 30.0–36.0)
MCV: 93.1 fL (ref 80.0–100.0)
Monocytes Absolute: 0.4 10*3/uL (ref 0.1–1.0)
Monocytes Relative: 6 %
Neutro Abs: 3.8 10*3/uL (ref 1.7–7.7)
Neutrophils Relative %: 58 %
Platelets: 298 10*3/uL (ref 150–400)
RBC: 5.06 MIL/uL (ref 4.22–5.81)
RDW: 14.5 % (ref 11.5–15.5)
WBC: 6.4 10*3/uL (ref 4.0–10.5)
nRBC: 0 % (ref 0.0–0.2)

## 2020-10-09 LAB — URINALYSIS, ROUTINE W REFLEX MICROSCOPIC
Bilirubin Urine: NEGATIVE
Glucose, UA: NEGATIVE mg/dL
Hgb urine dipstick: NEGATIVE
Ketones, ur: 5 mg/dL — AB
Leukocytes,Ua: NEGATIVE
Nitrite: NEGATIVE
Protein, ur: NEGATIVE mg/dL
Specific Gravity, Urine: 1.017 (ref 1.005–1.030)
pH: 6 (ref 5.0–8.0)

## 2020-10-09 LAB — BASIC METABOLIC PANEL
Anion gap: 9 (ref 5–15)
BUN: 9 mg/dL (ref 8–23)
CO2: 27 mmol/L (ref 22–32)
Calcium: 9.7 mg/dL (ref 8.9–10.3)
Chloride: 103 mmol/L (ref 98–111)
Creatinine, Ser: 0.92 mg/dL (ref 0.61–1.24)
GFR, Estimated: >60 mL/min (ref >60)
Glucose, Bld: 90 mg/dL (ref 70–99)
Potassium: 4.5 mmol/L (ref 3.5–5.1)
Sodium: 139 mmol/L (ref 135–145)

## 2020-10-09 LAB — D-DIMER, QUANTITATIVE: D-Dimer, Quant: 0.92 ug/mL-FEU — ABNORMAL HIGH (ref 0.00–0.50)

## 2020-10-09 MED ORDER — IOHEXOL 350 MG/ML SOLN
60.0000 mL | Freq: Once | INTRAVENOUS | Status: AC | PRN
  Administered 2020-10-09: 60 mL via INTRAVENOUS

## 2020-10-09 MED ORDER — FENTANYL CITRATE (PF) 100 MCG/2ML IJ SOLN
50.0000 ug | Freq: Once | INTRAMUSCULAR | Status: AC
  Administered 2020-10-09: 50 ug via INTRAVENOUS
  Filled 2020-10-09: qty 2

## 2020-10-09 MED ORDER — DOXYCYCLINE HYCLATE 100 MG PO CAPS: 100.0000 mg | ORAL_CAPSULE | Freq: Two times a day (BID) | ORAL | 0 refills | Status: DC

## 2020-10-09 MED ORDER — TRAMADOL HCL 50 MG PO TABS: 50.0000 mg | ORAL_TABLET | Freq: Four times a day (QID) | ORAL | 0 refills | Status: DC | PRN

## 2020-10-09 NOTE — ED Notes (Signed)
Discharge instructions provided to patient. Verbalized understanding. Alert and oriented. IV lock removed. Ambulated with steady gait out of ED. 

## 2020-10-09 NOTE — ED Notes (Signed)
Pt given urinal to produce specimen. Pt requested PO fluids to urinate. Given Sprite per pt request.

## 2020-10-09 NOTE — ED Provider Notes (Signed)
[facility] EMERGENCY DEPARTMENT Provider Note   CSN: [id number] Arrival date & time: 10/09/20  4098     History Chief Complaint  Patient presents with  . Chest Pain    Cory Curtis is a 64 y.o. male.  Patient is a 64-year male who presents with left-sided rib pain.  He states he woke up this morning with some pain in his left mid back along his ribs.  He says it hurts to move, cough and breathing.  He denies any shortness of breath.  No leg swelling.  He does not report any recent injuries to the area.  No fevers.  He denies any cough or congestion.  He has not taken anything at home for the pain.  No history of similar symptoms.  No associated abdominal pain.  No nausea or vomiting.  No known fevers.  No urinary symptoms.  Patient does smoke cigarettes.        Past Medical History:  Diagnosis Date  . Hypertension     Patient Active Problem List   Diagnosis Date Noted  . Essential hypertension 03/08/2016  . Osteoarthritis 03/08/2016    Past Surgical History:  Procedure Laterality Date  . LEG SURGERY         Family History  Problem Relation Age of Onset  . Cancer Brother   . Cancer Daughter     Social History   Tobacco Use  . Smoking status: Current Every Day Smoker    Packs/day: 0.50    Types: Cigarettes  . Smokeless tobacco: Never Used  . Tobacco comment: down to 1 pack every 4 days  Vaping Use  . Vaping Use: Never used  Substance Use Topics  . Alcohol use: Yes    Alcohol/week: 5.0 standard drinks    Types: 5 Cans of beer per week  . Drug use: No    Home Medications Prior to Admission medications   Medication Sig Start Date End Date Taking? Authorizing Provider  amLODipine (NORVASC) 10 MG tablet Take 1 tablet (10 mg total) by mouth daily. 02/05/18 10/09/20 Yes Claiborne Rigg, NP  naproxen (NAPROSYN) 500 MG tablet Take 1 tablet (500 mg total) by mouth 2 (two) times daily with a meal. 03/12/18  Yes Claiborne Rigg, NP   doxycycline (VIBRAMYCIN) 100 MG capsule Take 1 capsule (100 mg total) by mouth 2 (two) times daily. One po bid x 7 days 10/09/20   Rolan Bucco, MD  traMADol (ULTRAM) 50 MG tablet Take 1 tablet (50 mg total) by mouth every 6 (six) hours as needed. 10/09/20   Rolan Bucco, MD    Allergies    Tomato, Aspirin, Penicillins, and Predicort [prednisolone]  Review of Systems   Review of Systems  Constitutional: Negative for chills, diaphoresis, fatigue and fever.  HENT: Negative for congestion, rhinorrhea and sneezing.   Eyes: Negative.   Respiratory: Negative for cough, chest tightness and shortness of breath.   Cardiovascular: Positive for chest pain. Negative for leg swelling.  Gastrointestinal: Negative for abdominal pain, blood in stool, diarrhea, nausea and vomiting.  Genitourinary: Negative for difficulty urinating, flank pain, frequency and hematuria.  Musculoskeletal: Negative for arthralgias and back pain.  Skin: Negative for rash.  Neurological: Negative for dizziness, speech difficulty, weakness, numbness and headaches.    Physical Exam Updated Vital Signs BP (!) 180/106 (BP Location: Right Arm)   Pulse 83   Temp 99.1 F (37.3 C) (Oral)   Resp 16   Ht 5\' 9"  (1.753 m)  Wt 68 kg   SpO2 95%   BMI 22.15 kg/m   Physical Exam Constitutional:      Appearance: He is well-developed.  HENT:     Head: Normocephalic and atraumatic.  Eyes:     Pupils: Pupils are equal, round, and reactive to light.  Cardiovascular:     Rate and Rhythm: Normal rate and regular rhythm.     Heart sounds: Normal heart sounds.  Pulmonary:     Effort: Pulmonary effort is normal. No respiratory distress.     Breath sounds: Normal breath sounds. No wheezing or rales.  Chest:     Chest wall: Tenderness present.     Comments: Positive tenderness to the left lower posterior ribs.  No crepitus or deformity.  No overlying skin lesions or bruising.  No spinal tenderness. Abdominal:     General:  Bowel sounds are normal.     Palpations: Abdomen is soft.     Tenderness: There is abdominal tenderness. There is no guarding or rebound.     Comments: Mild tenderness in the left upper quadrant  Musculoskeletal:        General: Normal range of motion.     Cervical back: Normal range of motion and neck supple.     Comments: No edema or calf tenderness  Lymphadenopathy:     Cervical: No cervical adenopathy.  Skin:    General: Skin is warm and dry.     Findings: No rash.  Neurological:     Mental Status: He is alert and oriented to person, place, and time.     ED Results / Procedures / Treatments   Labs (all labs ordered are listed, but only abnormal results are displayed) Labs Reviewed  URINALYSIS, ROUTINE W REFLEX MICROSCOPIC - Abnormal; Notable for the following components:      Result Value   Ketones, ur 5 (*)    All other components within normal limits  D-DIMER, QUANTITATIVE (NOT AT [facility]) - Abnormal; Notable for the following components:   D-Dimer, Quant 0.92 (*)    All other components within normal limits  BASIC METABOLIC PANEL  CBC WITH DIFFERENTIAL/PLATELET    EKG EKG Interpretation  Date/Time:  Saturday October 09 2020 12:04:06 EST Ventricular Rate:  84 PR Interval:    QRS Duration: 84 QT Interval:  361 QTC Calculation: 427 R Axis:   73 Text Interpretation: Sinus rhythm Anteroseptal infarct, old since last tracing no significant change Confirmed by Rolan Bucco [phone]) on 10/09/2020 1:53:31 PM   Radiology DG Chest 2 View  Result Date: 10/09/2020 CLINICAL DATA:  Chest pain. EXAM: CHEST - 2 VIEW COMPARISON:  May 30, 2014. FINDINGS: The heart size and mediastinal contours are within normal limits. Both lungs are clear. No visible pleural effusions or pneumothorax. No evidence of acute osseous abnormality. IMPRESSION: No active cardiopulmonary disease. Electronically Signed   By: Feliberto Harts MD   On: 10/09/2020 10:33   CT Angio Chest PE W/Cm &/Or Wo  Cm  Result Date: 10/09/2020 CLINICAL DATA:  Left-sided chest pain EXAM: CT ANGIOGRAPHY CHEST WITH CONTRAST TECHNIQUE: Multidetector CT imaging of the chest was performed using the standard protocol during bolus administration of intravenous contrast. Multiplanar CT image reconstructions and MIPs were obtained to evaluate the vascular anatomy. CONTRAST:  32mL OMNIPAQUE IOHEXOL 350 MG/ML SOLN COMPARISON:  X-ray 10/09/2020 FINDINGS: Cardiovascular: Satisfactory opacification of the pulmonary arteries to the segmental level. No evidence of pulmonary embolism. Thoracic aorta is nonaneurysmal. Atherosclerotic calcification of the aorta and coronary arteries. Normal  heart size. No pericardial effusion. Mediastinum/Nodes: No enlarged mediastinal, hilar, or axillary lymph nodes. Thyroid gland, trachea, and esophagus demonstrate no significant findings. Lungs/Pleura: Mild centrilobular emphysema. Mild bibasilar atelectasis. Subtle tree-in-bud nodularity within the posterior aspect of the left upper lobe (series 6, images 69-71). No focal airspace consolidation. No pleural effusion or pneumothorax. Upper Abdomen: No acute abnormality. Musculoskeletal: No chest wall abnormality. No acute or significant osseous findings. Review of the MIP images confirms the above findings. IMPRESSION: 1. No evidence of pulmonary embolism. 2. Subtle tree-in-bud nodularity within the posterior aspect of the left upper lobe, which may represent an infectious or inflammatory bronchiolitis. 3. Emphysema and aortic atherosclerosis. Aortic Atherosclerosis (ICD10-I70.0) and Emphysema (ICD10-J43.9). Electronically Signed   By: Duanne Guess D.O.   On: 10/09/2020 15:54    Procedures Procedures (including critical care time)  Medications Ordered in ED Medications  fentaNYL (SUBLIMAZE) injection 50 mcg (50 mcg Intravenous Given 10/09/20 1249)  iohexol (OMNIPAQUE) 350 MG/ML injection 60 mL (60 mLs Intravenous Contrast Given 10/09/20 1543)     ED Course  I have reviewed the triage vital signs and the nursing notes.  Pertinent labs & imaging results that were available during my care of the patient were reviewed by me and considered in my medical decision making (see chart for details).    MDM Rules/Calculators/A&P                          Patient is a 64 year old male who presents with some left-sided reproducible chest pain.  Seems to be more over his ribs.  There is no lesions or suggestions of shingles.  He had some flank pain.  His urine is clear.  There is no signs of infection or hematuria which would be more concerning for renal colic.  He does not have any symptoms that sound more concerning for ACS.  His EKG does not show any ischemic changes.  He did have some pleuritic pain and he is a smoker.  His chest x-ray is clear without evidence of pneumonia or rib fracture.  His D-dimer was elevated.  CT scan of his chest was performed which shows no evidence of PE.  There is some questionable early infectious process.  He does report a mild cough.  I feel like his pain is mostly musculoskeletal in nature.  He was discharged home in good condition.  He was given a prescription for tramadol for pain.  He was also given a prescription for doxycycline.  He was encouraged to follow-up with his PCP for recheck.  His blood pressure can be rechecked at that time.  Return precautions were given.    Final Clinical Impression(s) / ED Diagnoses Final diagnoses:  Chest wall pain    Rx / DC Orders ED Discharge Orders         Ordered    traMADol (ULTRAM) 50 MG tablet  Every 6 hours PRN        10/09/20 1656    doxycycline (VIBRAMYCIN) 100 MG capsule  2 times daily        10/09/20 1656           Rolan Bucco, MD 10/09/20 1659

## 2020-10-09 NOTE — ED Triage Notes (Signed)
C/o  Left rib pain onset last pm denies fall.

## 2020-10-13 ENCOUNTER — Emergency Department (HOSPITAL_COMMUNITY): Payer: No Typology Code available for payment source

## 2020-10-13 ENCOUNTER — Encounter (HOSPITAL_COMMUNITY): Payer: Self-pay | Admitting: Emergency Medicine

## 2020-10-13 ENCOUNTER — Emergency Department (HOSPITAL_COMMUNITY)
Admission: EM | Admit: 2020-10-13 | Discharge: 2020-10-13 | Disposition: A | Discharge status: Home / Self Care | Payer: No Typology Code available for payment source | Attending: Emergency Medicine | Admitting: Emergency Medicine

## 2020-10-13 DIAGNOSIS — I10 Essential (primary) hypertension: Secondary | ICD-10-CM | POA: Insufficient documentation

## 2020-10-13 DIAGNOSIS — R109 Unspecified abdominal pain: Secondary | ICD-10-CM | POA: Diagnosis present

## 2020-10-13 DIAGNOSIS — R300 Dysuria: Secondary | ICD-10-CM | POA: Insufficient documentation

## 2020-10-13 DIAGNOSIS — F17210 Nicotine dependence, cigarettes, uncomplicated: Secondary | ICD-10-CM | POA: Insufficient documentation

## 2020-10-13 DIAGNOSIS — R3911 Hesitancy of micturition: Secondary | ICD-10-CM | POA: Diagnosis not present

## 2020-10-13 DIAGNOSIS — K5900 Constipation, unspecified: Secondary | ICD-10-CM

## 2020-10-13 LAB — BASIC METABOLIC PANEL
Anion gap: 11 (ref 5–15)
BUN: 14 mg/dL (ref 8–23)
CO2: 26 mmol/L (ref 22–32)
Calcium: 9.6 mg/dL (ref 8.9–10.3)
Chloride: 101 mmol/L (ref 98–111)
Creatinine, Ser: 1.19 mg/dL (ref 0.61–1.24)
GFR, Estimated: >60 mL/min (ref >60)
Glucose, Bld: 101 mg/dL — ABNORMAL HIGH (ref 70–99)
Potassium: 4.1 mmol/L (ref 3.5–5.1)
Sodium: 138 mmol/L (ref 135–145)

## 2020-10-13 LAB — CBC
HCT: 48.9 % (ref 39.0–52.0)
Hemoglobin: 16 g/dL (ref 13.0–17.0)
MCH: 31.1 pg (ref 26.0–34.0)
MCHC: 32.7 g/dL (ref 30.0–36.0)
MCV: 95 fL (ref 80.0–100.0)
Platelets: 328 10*3/uL (ref 150–400)
RBC: 5.15 MIL/uL (ref 4.22–5.81)
RDW: 14.3 % (ref 11.5–15.5)
WBC: 6.9 10*3/uL (ref 4.0–10.5)
nRBC: 0 % (ref 0.0–0.2)

## 2020-10-13 LAB — URINALYSIS, ROUTINE W REFLEX MICROSCOPIC
Bilirubin Urine: NEGATIVE
Glucose, UA: NEGATIVE mg/dL
Hgb urine dipstick: NEGATIVE
Ketones, ur: NEGATIVE mg/dL
Leukocytes,Ua: NEGATIVE
Nitrite: NEGATIVE
Protein, ur: NEGATIVE mg/dL
Specific Gravity, Urine: 1.009 (ref 1.005–1.030)
pH: 5 (ref 5.0–8.0)

## 2020-10-13 MED ORDER — HYDROCODONE-ACETAMINOPHEN 5-325 MG PO TABS
2.0000 | ORAL_TABLET | Freq: Once | ORAL | Status: AC
  Administered 2020-10-13: 2 via ORAL
  Filled 2020-10-13: qty 2

## 2020-10-13 MED ORDER — LIDOCAINE 5 % EX PTCH
2.0000 | MEDICATED_PATCH | CUTANEOUS | Status: DC
  Administered 2020-10-13: 2 via TRANSDERMAL
  Filled 2020-10-13: qty 2

## 2020-10-13 NOTE — Discharge Instructions (Addendum)
Take miralax as directed.   Additionally, you had some degenerative changes in your right hip. You need to follow up with the orthopedic doctor in regards to this.  In regard to your constipation, please follow up with your primary care provider within 5-7 days for re-evaluation of your symptoms.   Please return to the emergency department for any new or worsening symptoms.

## 2020-10-13 NOTE — ED Provider Notes (Signed)
Care assumed from [employer], [place]. See his note for full H&P.   Per his note, " Cory Curtis is a 64 y.o. male.  Patient presents to the emergency department with chief complaint of left flank pain.  He states that he was recently diagnosed with pneumonia and a kidney infection.  He has been taking doxycycline.  He had a recent CT PE study which showed questionable infectious process, but no PE.  He states that today his pain is mostly on his left flank.  He denies seeing any rash.  He states his symptoms are worsened with movement and palpation.  He has had some associated urinary hesitancy and dysuria.  He denies nausea or vomiting.  He has had slight cough, but denies fever.  The history is provided by the patient. No language interpreter was used. "   Physical Exam  BP (!) 152/107   Pulse 67   Temp (!) 97.3 F (36.3 C) (Oral)   Resp 13   SpO2 97%   Physical Exam    ED Course/Procedures     Procedures  Results for orders placed or performed during the hospital encounter of 10/13/20  Urinalysis, Routine w reflex microscopic  Result Value Ref Range   Color, Urine YELLOW YELLOW   APPearance CLEAR CLEAR   Specific Gravity, Urine 1.009 1.005 - 1.030   pH 5.0 5.0 - 8.0   Glucose, UA NEGATIVE NEGATIVE mg/dL   Hgb urine dipstick NEGATIVE NEGATIVE   Bilirubin Urine NEGATIVE NEGATIVE   Ketones, ur NEGATIVE NEGATIVE mg/dL   Protein, ur NEGATIVE NEGATIVE mg/dL   Nitrite NEGATIVE NEGATIVE   Leukocytes,Ua NEGATIVE NEGATIVE  Basic metabolic panel  Result Value Ref Range   Sodium 138 135 - 145 mmol/L   Potassium 4.1 3.5 - 5.1 mmol/L   Chloride 101 98 - 111 mmol/L   CO2 26 22 - 32 mmol/L   Glucose, Bld 101 (H) 70 - 99 mg/dL   BUN 14 8 - 23 mg/dL   Creatinine, Ser 6.94 0.61 - 1.24 mg/dL   Calcium 9.6 8.9 - 50.3 mg/dL   GFR, Estimated >88 >82 mL/min   Anion gap 11 5 - 15  CBC  Result Value Ref Range   WBC 6.9 4.0 - 10.5 K/uL   RBC 5.15 4.22 - 5.81 MIL/uL   Hemoglobin  16.0 13.0 - 17.0 g/dL   HCT 80.0 39 - 52 %   MCV 95.0 80.0 - 100.0 fL   MCH 31.1 26.0 - 34.0 pg   MCHC 32.7 30.0 - 36.0 g/dL   RDW 34.9 17.9 - 15.0 %   Platelets 328 150 - 400 K/uL   nRBC 0.0 0.0 - 0.2 %   DG Chest 2 View  Result Date: 10/09/2020 CLINICAL DATA:  Chest pain. EXAM: CHEST - 2 VIEW COMPARISON:  May 30, 2014. FINDINGS: The heart size and mediastinal contours are within normal limits. Both lungs are clear. No visible pleural effusions or pneumothorax. No evidence of acute osseous abnormality. IMPRESSION: No active cardiopulmonary disease. Electronically Signed   By: Feliberto Harts MD   On: 10/09/2020 10:33   CT Angio Chest PE W/Cm &/Or Wo Cm  Result Date: 10/09/2020 CLINICAL DATA:  Left-sided chest pain EXAM: CT ANGIOGRAPHY CHEST WITH CONTRAST TECHNIQUE: Multidetector CT imaging of the chest was performed using the standard protocol during bolus administration of intravenous contrast. Multiplanar CT image reconstructions and MIPs were obtained to evaluate the vascular anatomy. CONTRAST:  45mL OMNIPAQUE IOHEXOL 350 MG/ML SOLN COMPARISON:  X-ray 10/09/2020 FINDINGS: Cardiovascular: Satisfactory opacification of the pulmonary arteries to the segmental level. No evidence of pulmonary embolism. Thoracic aorta is nonaneurysmal. Atherosclerotic calcification of the aorta and coronary arteries. Normal heart size. No pericardial effusion. Mediastinum/Nodes: No enlarged mediastinal, hilar, or axillary lymph nodes. Thyroid gland, trachea, and esophagus demonstrate no significant findings. Lungs/Pleura: Mild centrilobular emphysema. Mild bibasilar atelectasis. Subtle tree-in-bud nodularity within the posterior aspect of the left upper lobe (series 6, images 69-71). No focal airspace consolidation. No pleural effusion or pneumothorax. Upper Abdomen: No acute abnormality. Musculoskeletal: No chest wall abnormality. No acute or significant osseous findings. Review of the MIP images confirms the  above findings. IMPRESSION: 1. No evidence of pulmonary embolism. 2. Subtle tree-in-bud nodularity within the posterior aspect of the left upper lobe, which may represent an infectious or inflammatory bronchiolitis. 3. Emphysema and aortic atherosclerosis. Aortic Atherosclerosis (ICD10-I70.0) and Emphysema (ICD10-J43.9). Electronically Signed   By: Duanne Guess D.O.   On: 10/09/2020 15:54   CT Renal Stone Study  Result Date: 10/13/2020 CLINICAL DATA:  Severe left flank pain since Saturday. EXAM: CT ABDOMEN AND PELVIS WITHOUT CONTRAST TECHNIQUE: Multidetector CT imaging of the abdomen and pelvis was performed following the standard protocol without IV contrast. COMPARISON:  CTA chest 10/09/2020. FINDINGS: Lower chest: Mild dependent atelectasis at both lung bases has increased slightly. Small left pleural effusion is present. Hepatobiliary: No focal liver abnormality is seen. No gallstones, gallbladder wall thickening, or biliary dilatation. Pancreas: Unremarkable. No pancreatic ductal dilatation or surrounding inflammatory changes. Spleen: Normal in size without focal abnormality. Adrenals/Urinary Tract: Adrenal glands are normal bilaterally. Kidneys and ureters are unremarkable. No stone or mass lesion is present. No obstruction is present. The urinary bladder is within normal limits. Stomach/Bowel: Stomach and duodenum are within normal limits. Small bowel is unremarkable. Moderate stool is present throughout the colon. No focal inflammation is present. Vascular/Lymphatic: Atherosclerotic calcifications are present in the aorta and branch vessels. No aneurysm present. No significant adenopathy is present. Reproductive: Prostate is unremarkable. Other: No abdominal wall hernia or abnormality. No abdominopelvic ascites. Musculoskeletal: Chronic sclerotic changes are present at L4-5 and L5-S1. Multilevel facet degenerative changes are worse left than right. Severe chronic degenerative changes are present in  the right hip with collapse of the femoral head and advanced sclerotic and cystic changes. No acute abnormalities are present. IMPRESSION: 1. No acute or focal lesion to explain the patient's symptoms. 2. Moderate stool throughout the colon without obstruction. 3. 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 4. Aortic Atherosclerosis (ICD10-I70.0). Electronically Signed   By: Marin Roberts M.D.   On: 10/13/2020 07:35     MDM   64 y/o M presenting or eval of left flank pain  Had recent CT PE study. Currently taking doxycycline for possible pneumonia. At shift change, pending ct renal scan. Labs reassuring. No stone noted on ct scan. Moderate stool noted throughout the colon.   Pt made aware of results. He will be given miralax for the constipation. He was also given ortho f/u. Advised on return precautions. He voices understanding and is in agreement with plan. All questions answered, pt stable for discharge.          Karrie Meres, PA-C 10/13/20 1108    Derwood Kaplan, MD 10/15/20 [phone]

## 2020-10-13 NOTE — ED Triage Notes (Signed)
Pt reports he was here a few days ago, diagnosed with pna and kidney infection, states worsening L flank pain and dysuria.

## 2020-10-13 NOTE — ED Notes (Signed)
Patient verbalizes understanding of discharge instructions. Opportunity for questioning and answers were provided. Armband removed by staff, pt discharged from ED and ambulated to lobby to return home.   

## 2020-10-13 NOTE — ED Provider Notes (Signed)
[facility] EMERGENCY DEPARTMENT Provider Note   CSN: [id number] Arrival date & time: 10/13/20  0353     History Chief Complaint  Patient presents with  . Flank Pain    Cory Curtis is a 64 y.o. male.  Patient presents to the emergency department with chief complaint of left flank pain.  He states that he was recently diagnosed with pneumonia and a kidney infection.  He has been taking doxycycline.  He had a recent CT PE study which showed questionable infectious process, but no PE.  He states that today his pain is mostly on his left flank.  He denies seeing any rash.  He states his symptoms are worsened with movement and palpation.  He has had some associated urinary hesitancy and dysuria.  He denies nausea or vomiting.  He has had slight cough, but denies fever.  The history is provided by the patient. No language interpreter was used.       Past Medical History:  Diagnosis Date  . Hypertension     Patient Active Problem List   Diagnosis Date Noted  . Essential hypertension 03/08/2016  . Osteoarthritis 03/08/2016    Past Surgical History:  Procedure Laterality Date  . LEG SURGERY         Family History  Problem Relation Age of Onset  . Cancer Brother   . Cancer Daughter     Social History   Tobacco Use  . Smoking status: Current Every Day Smoker    Packs/day: 0.50    Types: Cigarettes  . Smokeless tobacco: Never Used  . Tobacco comment: down to 1 pack every 4 days  Vaping Use  . Vaping Use: Never used  Substance Use Topics  . Alcohol use: Yes    Alcohol/week: 5.0 standard drinks    Types: 5 Cans of beer per week  . Drug use: No    Home Medications Prior to Admission medications   Medication Sig Start Date End Date Taking? Authorizing Provider  amLODipine (NORVASC) 10 MG tablet Take 1 tablet (10 mg total) by mouth daily. 02/05/18 10/09/20  Claiborne Rigg, NP  doxycycline (VIBRAMYCIN) 100 MG capsule Take 1 capsule (100 mg  total) by mouth 2 (two) times daily. One po bid x 7 days 10/09/20   Rolan Bucco, MD  naproxen (NAPROSYN) 500 MG tablet Take 1 tablet (500 mg total) by mouth 2 (two) times daily with a meal. 03/12/18   Claiborne Rigg, NP  traMADol (ULTRAM) 50 MG tablet Take 1 tablet (50 mg total) by mouth every 6 (six) hours as needed. 10/09/20   Rolan Bucco, MD    Allergies    Tomato, Aspirin, Penicillins, and Predicort [prednisolone]  Review of Systems   Review of Systems  All other systems reviewed and are negative.   Physical Exam Updated Vital Signs BP (!) 184/90 (BP Location: Right Arm)   Pulse 83   Temp (!) 97.3 F (36.3 C) (Oral)   Resp 18   SpO2 98%   Physical Exam Vitals and nursing note reviewed.  Constitutional:      Appearance: He is well-developed.  HENT:     Head: Normocephalic and atraumatic.  Eyes:     Conjunctiva/sclera: Conjunctivae normal.  Cardiovascular:     Rate and Rhythm: Normal rate and regular rhythm.     Heart sounds: No murmur heard.   Pulmonary:     Effort: Pulmonary effort is normal. No respiratory distress.     Breath sounds: Normal  breath sounds.  Abdominal:     Palpations: Abdomen is soft.     Tenderness: There is no abdominal tenderness.  Musculoskeletal:        General: Normal range of motion.     Cervical back: Neck supple.     Comments: Left flank TTP  Skin:    General: Skin is warm and dry.     Comments: No rash  Neurological:     Mental Status: He is alert and oriented to person, place, and time.  Psychiatric:        Mood and Affect: Mood normal.        Behavior: Behavior normal.     ED Results / Procedures / Treatments   Labs (all labs ordered are listed, but only abnormal results are displayed) Labs Reviewed  BASIC METABOLIC PANEL - Abnormal; Notable for the following components:      Result Value   Glucose, Bld 101 (*)    All other components within normal limits  CBC  URINALYSIS, ROUTINE W REFLEX MICROSCOPIC     EKG None  Radiology No results found.  Procedures Procedures (including critical care time)  Medications Ordered in ED Medications  lidocaine (LIDODERM) 5 % 2 patch (has no administration in time range)    ED Course  I have reviewed the triage vital signs and the nursing notes.  Pertinent labs & imaging results that were available during my care of the patient were reviewed by me and considered in my medical decision making (see chart for details).    MDM Rules/Calculators/A&P                          Patient here with flank pain on the left side.  It is been ongoing for the past 4 days, but worsened today.  He was seen recently and had a CT PE study which showed questionable infectious process on 11/20.  He was also told that he had a kidney infection.  He has been taking doxycycline.  Laboratory work-up was unremarkable.  Patient does appear uncomfortable.  He has no evidence of rash or shingles.  I most suspicious of muscle spasm, but he has been complaining of some dysuria.  Will check CT renal to rule out stone.  Urinalysis is also pending.  Patient signed out to [place], PA-C, who will continue care.  Final Clinical Impression(s) / ED Diagnoses Final diagnoses:  None    Rx / DC Orders ED Discharge Orders    None       Roxy Horseman, PA-C 10/13/20 0618    Nira Conn, MD 10/13/20 [phone]

## 2020-10-13 NOTE — ED Notes (Signed)
Pt transported to CT ?

## 2021-05-23 ENCOUNTER — Encounter (HOSPITAL_COMMUNITY): Payer: Self-pay | Admitting: Emergency Medicine

## 2021-05-23 ENCOUNTER — Emergency Department (HOSPITAL_COMMUNITY): Payer: No Typology Code available for payment source

## 2021-05-23 ENCOUNTER — Emergency Department (HOSPITAL_COMMUNITY)
Admission: EM | Admit: 2021-05-23 | Discharge: 2021-05-23 | Disposition: A | Discharge status: Home / Self Care | Payer: No Typology Code available for payment source | Attending: Emergency Medicine | Admitting: Emergency Medicine

## 2021-05-23 ENCOUNTER — Other Ambulatory Visit: Payer: Self-pay

## 2021-05-23 DIAGNOSIS — S161XXD Strain of muscle, fascia and tendon at neck level, subsequent encounter: Secondary | ICD-10-CM

## 2021-05-23 DIAGNOSIS — S199XXA Unspecified injury of neck, initial encounter: Secondary | ICD-10-CM | POA: Diagnosis present

## 2021-05-23 DIAGNOSIS — F17210 Nicotine dependence, cigarettes, uncomplicated: Secondary | ICD-10-CM | POA: Insufficient documentation

## 2021-05-23 DIAGNOSIS — S161XXA Strain of muscle, fascia and tendon at neck level, initial encounter: Secondary | ICD-10-CM | POA: Insufficient documentation

## 2021-05-23 DIAGNOSIS — Y92410 Unspecified street and highway as the place of occurrence of the external cause: Secondary | ICD-10-CM | POA: Insufficient documentation

## 2021-05-23 DIAGNOSIS — Z79899 Other long term (current) drug therapy: Secondary | ICD-10-CM | POA: Insufficient documentation

## 2021-05-23 DIAGNOSIS — I10 Essential (primary) hypertension: Secondary | ICD-10-CM | POA: Diagnosis not present

## 2021-05-23 MED ORDER — HYDROCODONE-ACETAMINOPHEN 5-325 MG PO TABS
1.0000 | ORAL_TABLET | Freq: Once | ORAL | Status: AC
  Administered 2021-05-23: 1 via ORAL
  Filled 2021-05-23: qty 1

## 2021-05-23 NOTE — ED Notes (Signed)
Called x 2 for triage. 
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
## 2022-07-12 ENCOUNTER — Encounter: Payer: Self-pay | Admitting: Nurse Practitioner

## 2022-07-12 ENCOUNTER — Ambulatory Visit (INDEPENDENT_AMBULATORY_CARE_PROVIDER_SITE_OTHER): Payer: Medicare Other | Admitting: Nurse Practitioner

## 2022-07-12 VITALS — BP 148/76 | HR 75 | Ht 69.0 in | Wt 147.4 lb

## 2022-07-12 DIAGNOSIS — G8929 Other chronic pain: Secondary | ICD-10-CM

## 2022-07-12 DIAGNOSIS — Z0000 Encounter for general adult medical examination without abnormal findings: Secondary | ICD-10-CM

## 2022-07-12 DIAGNOSIS — M25562 Pain in left knee: Secondary | ICD-10-CM | POA: Diagnosis not present

## 2022-07-12 DIAGNOSIS — R5383 Other fatigue: Secondary | ICD-10-CM

## 2022-07-12 DIAGNOSIS — Z125 Encounter for screening for malignant neoplasm of prostate: Secondary | ICD-10-CM

## 2022-07-12 DIAGNOSIS — I10 Essential (primary) hypertension: Secondary | ICD-10-CM

## 2022-07-12 DIAGNOSIS — Z7689 Persons encountering health services in other specified circumstances: Secondary | ICD-10-CM

## 2022-07-12 DIAGNOSIS — M064 Inflammatory polyarthropathy: Secondary | ICD-10-CM | POA: Diagnosis not present

## 2022-07-12 MED ORDER — AMLODIPINE BESYLATE 5 MG PO TABS: 5.0000 mg | ORAL_TABLET | Freq: Every day | ORAL | 1 refills | Status: AC

## 2022-07-12 MED ORDER — CELECOXIB 100 MG PO CAPS: 100.0000 mg | ORAL_CAPSULE | Freq: Two times a day (BID) | ORAL | 1 refills | Status: DC | PRN

## 2022-07-12 NOTE — Progress Notes (Signed)
New Patient Office Visit  Subjective    Patient ID: Cory Curtis, male    DOB: 1955-12-16  Age: 66 y.o. MRN: [id number]  CC:  Chief Complaint  Patient presents with   New Patient (Initial Visit)    HPI Cory Curtis presents to establish care [facility]. States that it has been over a year since her has been there.  -blood pressure elevated today. Has not been taking amlodipine. States that last visit at [place], he was told he didn't need to have the blood pressure medication any longer.  -arthritis in both hands.  -leg pain, left lower - used t have right lower leg pain. [facility] told him this was related to right knee arthritis. Is unable to have knee replacement because the right leg is "too small."   Outpatient Encounter Medications as of 07/12/2022  Medication Sig   amLODipine (NORVASC) 5 MG tablet Take 1 tablet (5 mg total) by mouth daily.   celecoxib (CELEBREX) 100 MG capsule Take 1 capsule (100 mg total) by mouth 2 (two) times daily as needed.   doxycycline (VIBRAMYCIN) 100 MG capsule Take 1 capsule (100 mg total) by mouth 2 (two) times daily. One po bid x 7 days   traMADol (ULTRAM) 50 MG tablet Take 1 tablet (50 mg total) by mouth every 6 (six) hours as needed. (Patient taking differently: Take 50 mg by mouth every 6 (six) hours as needed for moderate pain.)   naproxen (NAPROSYN) 500 MG tablet Take 1 tablet (500 mg total) by mouth 2 (two) times daily with a meal. (Patient not taking: Reported on 10/13/2020)   [DISCONTINUED] amLODipine (NORVASC) 10 MG tablet Take 1 tablet (10 mg total) by mouth daily.   No facility-administered encounter medications on file as of 07/12/2022.    Past Medical History:  Diagnosis Date   Hypertension     Past Surgical History:  Procedure Laterality Date   LEG SURGERY      Family History  Problem Relation Age of Onset   Cancer Brother    Cancer Daughter     Social History   Socioeconomic  History   Marital status: Divorced    Spouse name: Not on file   Number of children: Not on file   Years of education: Not on file   Highest education level: Not on file  Occupational History   Not on file  Tobacco Use   Smoking status: Every Day    Packs/day: 0.50    Types: Cigarettes   Smokeless tobacco: Never   Tobacco comments:    down to 1 pack every 4 days  Vaping Use   Vaping Use: Never used  Substance and Sexual Activity   Alcohol use: Yes    Alcohol/week: 5.0 standard drinks of alcohol    Types: 5 Cans of beer per week   Drug use: No   Sexual activity: Yes  Other Topics Concern   Not on file  Social History Narrative   Not on file   Social Determinants of Health   Financial Resource Strain: Not on file  Food Insecurity: Not on file  Transportation Needs: Not on file  Physical Activity: Not on file  Stress: Not on file  Social Connections: Not on file  Intimate Partner Violence: Not on file    Review of Systems  Constitutional:  Negative for chills, fever and malaise/fatigue.  HENT:  Negative for congestion, sinus pain and sore throat.  Eyes: Negative.   Respiratory:  Negative for cough, shortness of breath and wheezing.   Cardiovascular:  Negative for chest pain, palpitations and leg swelling.       Well managed blood pressure   Gastrointestinal:  Negative for constipation, diarrhea, nausea and vomiting.  Genitourinary: Negative.   Musculoskeletal:  Positive for joint pain. Negative for myalgias.       Joint pain and swelling in hands and fingers. Also has joint pain in both knees, worse on right side.   Skin: Negative.   Neurological:  Negative for dizziness and headaches.  Endo/Heme/Allergies:  Does not bruise/bleed easily.  Psychiatric/Behavioral:  Negative for depression. The patient is not nervous/anxious.         Objective    Today's Vitals   07/12/22 1446 07/12/22 1506  BP: (!) 154/78 (!) 148/76  Pulse: 75   SpO2: 98%   Weight: 147 lb  6.4 oz (66.9 kg)   Height: 5\' 9"  (1.753 m)    Body mass index is 21.77 kg/m.   Physical Exam Vitals and nursing note reviewed.  Constitutional:      Appearance: Normal appearance. He is well-developed.  HENT:     Head: Normocephalic.  Eyes:     Pupils: Pupils are equal, round, and reactive to light.  Cardiovascular:     Rate and Rhythm: Normal rate and regular rhythm.     Pulses: Normal pulses.     Heart sounds: Normal heart sounds.  Pulmonary:     Effort: Pulmonary effort is normal.     Breath sounds: Normal breath sounds.  Abdominal:     Palpations: Abdomen is soft.  Musculoskeletal:        General: Normal range of motion.     Cervical back: Normal range of motion and neck supple.     Comments: Noted joint swelling in  the fingers and wrists of both hands. Intact ROM but decreased grip strength bilaterally.  Right knee tender with some swelling. Full ROM and strength  noted.   Lymphadenopathy:     Cervical: No cervical adenopathy.  Skin:    General: Skin is warm and dry.     Capillary Refill: Capillary refill takes less than 2 seconds.  Neurological:     General: No focal deficit present.     Mental Status: He is alert and oriented to person, place, and time.  Psychiatric:        Mood and Affect: Mood normal.        Behavior: Behavior normal.        Thought Content: Thought content normal.        Judgment: Judgment normal.     Assessment & Plan:  1. Inflammatory polyarthropathy (HCC) Trial Celebrex 100 mg up to twice daily if needed for pain.  We will get x-ray of left knee for further evaluation.  Refer to orthopedics if indicated. - celecoxib (CELEBREX) 100 MG capsule; Take 1 capsule (100 mg total) by mouth 2 (two) times daily as needed.  Dispense: 60 capsule; Refill: 1 - DG Knee 1-2 Views Left; Future  2. Essential hypertension Restart amlodipine 5 mg tablets daily.  Warned him to avoid excess sodium and increase water intake.  Monitor blood pressure at home.   He understands the goal of blood pressure is to be 140/80 or better.  We will reassess in 6 - amLODipine (NORVASC) 5 MG tablet; Take 1 tablet (5 mg total) by mouth daily.  Dispense: 90 tablet; Refill: 1  3. Chronic pain  of left knee X-ray of the left knee ordered for further evaluation.  Refer to orthopedics if indicated. - DG Knee 1-2 Views Left; Future  4. Encounter to establish care Appointment today to establish new primary care provider.     Problem List Items Addressed This Visit       Cardiovascular and Mediastinum   Essential hypertension   Relevant Medications   amLODipine (NORVASC) 5 MG tablet     Musculoskeletal and Integument   Inflammatory polyarthropathy (HCC) - Primary   Relevant Medications   celecoxib (CELEBREX) 100 MG capsule   Other Relevant Orders   DG Knee 1-2 Views Left     Other   Chronic pain of left knee   Relevant Medications   celecoxib (CELEBREX) 100 MG capsule   Other Relevant Orders   DG Knee 1-2 Views Left   Other Visit Diagnoses     Encounter to establish care           Return in about 6 weeks (around 08/23/2022) for medicare wellness, FBW a week prior to visit. add PSA.   Carlean Jews, NP

## 2022-07-24 DIAGNOSIS — M064 Inflammatory polyarthropathy: Secondary | ICD-10-CM | POA: Insufficient documentation

## 2022-07-24 DIAGNOSIS — G8929 Other chronic pain: Secondary | ICD-10-CM | POA: Insufficient documentation

## 2022-08-17 ENCOUNTER — Other Ambulatory Visit: Payer: No Typology Code available for payment source

## 2022-08-23 ENCOUNTER — Encounter: Payer: No Typology Code available for payment source | Admitting: Nurse Practitioner

## 2022-10-23 ENCOUNTER — Other Ambulatory Visit: Payer: Self-pay | Admitting: Nurse Practitioner

## 2022-10-23 DIAGNOSIS — M064 Inflammatory polyarthropathy: Secondary | ICD-10-CM

## 2022-10-23 NOTE — Telephone Encounter (Signed)
Office visit is required for refills 

## 2023-02-02 DIAGNOSIS — M19042 Primary osteoarthritis, left hand: Secondary | ICD-10-CM | POA: Diagnosis not present

## 2023-02-02 DIAGNOSIS — M19041 Primary osteoarthritis, right hand: Secondary | ICD-10-CM | POA: Diagnosis not present

## 2023-02-02 DIAGNOSIS — I10 Essential (primary) hypertension: Secondary | ICD-10-CM | POA: Diagnosis not present
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
## 2023-07-24 ENCOUNTER — Telehealth: Payer: Self-pay | Admitting: *Deleted

## 2023-07-24 NOTE — Telephone Encounter (Signed)
Pt calling stating he had a missed call from our office.  I told him I did not see anything and would send a message to see if anyone had reached out to him.

## 2023-09-10 DIAGNOSIS — M2012 Hallux valgus (acquired), left foot: Secondary | ICD-10-CM | POA: Diagnosis not present

## 2023-09-10 DIAGNOSIS — M109 Gout, unspecified: Secondary | ICD-10-CM | POA: Diagnosis not present

## 2023-09-10 DIAGNOSIS — M19071 Primary osteoarthritis, right ankle and foot: Secondary | ICD-10-CM | POA: Diagnosis not present

## 2023-09-10 DIAGNOSIS — M2011 Hallux valgus (acquired), right foot: Secondary | ICD-10-CM | POA: Diagnosis not present

## 2023-09-10 DIAGNOSIS — M19072 Primary osteoarthritis, left ankle and foot: Secondary | ICD-10-CM | POA: Diagnosis not present

## 2024-09-02 ENCOUNTER — Ambulatory Visit (INDEPENDENT_AMBULATORY_CARE_PROVIDER_SITE_OTHER)

## 2024-09-02 ENCOUNTER — Encounter (HOSPITAL_COMMUNITY): Payer: Self-pay

## 2024-09-02 ENCOUNTER — Other Ambulatory Visit (HOSPITAL_COMMUNITY): Payer: Self-pay

## 2024-09-02 ENCOUNTER — Ambulatory Visit (HOSPITAL_COMMUNITY)
Admission: EM | Admit: 2024-09-02 | Discharge: 2024-09-02 | Disposition: A | Discharge status: Home / Self Care | Attending: Family Medicine | Admitting: Family Medicine

## 2024-09-02 DIAGNOSIS — M79671 Pain in right foot: Secondary | ICD-10-CM | POA: Diagnosis not present

## 2024-09-02 MED ORDER — PREDNISONE 50 MG PO TABS
50.0000 mg | ORAL_TABLET | Freq: Every day | ORAL | 0 refills | Status: AC
  Filled 2024-09-02: qty 5, 5d supply, fill #0

## 2024-09-02 NOTE — ED Triage Notes (Signed)
 Patient here today with c/o right foot pain and swelling for over a month now. Patient has a h/o bunions. Patient had some swelling in his left foot but that went down.

## 2024-09-02 NOTE — Discharge Instructions (Addendum)
 You were seen today for foot pain and swelling.  Your xray did not show any acute abnormality where you are having pain.  I will send out oral prednisone  to take for several days to see if helpful.  I have also given you a boot If this does not help, or if you have continuing issues, you should follow up with your primary care provider.  Or, you  may follow up with an orthopedist by calling [facility] at [phone].

## 2024-09-02 NOTE — ED Provider Notes (Signed)
 [facility]    CSN: [id number] Arrival date & time: 09/02/24  9066      History   Chief Complaint Chief Complaint  Patient presents with   Foot Pain    HPI Cory Curtis is a 68 y.o. male.    Foot Pain  Patient is here for right foot pain and swelling x 1 month.  No known injury.  He has pain every day, and it stays swollen.  He takes tylenol  without much help.  He did have some swelling to the left foot, but hat has improved.   Of note, prednisilone is listed as an allergy, but he was given this 02/2016 without mention of side affects.        Past Medical History:  Diagnosis Date   Hypertension     Patient Active Problem List   Diagnosis Date Noted   Inflammatory polyarthropathy (HCC) 07/24/2022   Chronic pain of left knee 07/24/2022   Essential hypertension 03/08/2016   Osteoarthritis 03/08/2016    Past Surgical History:  Procedure Laterality Date   LEG SURGERY         Home Medications    Prior to Admission medications   Medication Sig Start Date End Date Taking? Authorizing Provider  amLODipine  (NORVASC ) 5 MG tablet Take 1 tablet (5 mg total) by mouth daily. Patient not taking: Reported on 09/02/2024 07/12/22   Hanford Powell BRAVO, NP  celecoxib  (CELEBREX ) 100 MG capsule TAKE 1 CAPSULE(100 MG) BY MOUTH TWICE DAILY AS NEEDED Patient not taking: Reported on 09/02/2024 10/23/22   Hanford Powell BRAVO, NP    Family History Family History  Problem Relation Age of Onset   Cancer Brother    Cancer Daughter     Social History Social History   Tobacco Use   Smoking status: Every Day    Current packs/day: 0.50    Types: Cigarettes   Smokeless tobacco: Never   Tobacco comments:    down to 1 pack every 4 days  Vaping Use   Vaping status: Never Used  Substance Use Topics   Alcohol use: Yes    Alcohol/week: 5.0 standard drinks of alcohol    Types: 5 Cans of beer per week   Drug use: Yes    Types: Marijuana     Allergies    Tomato, Aspirin, Penicillins, and Predicort [prednisolone]   Review of Systems Review of Systems  Constitutional: Negative.   HENT: Negative.    Respiratory: Negative.    Cardiovascular: Negative.   Gastrointestinal: Negative.   Musculoskeletal:  Positive for joint swelling.     Physical Exam Triage Vital Signs ED Triage Vitals [09/02/24 1014]  Encounter Vitals Group     BP (!) 178/101     Girls Systolic BP Percentile      Girls Diastolic BP Percentile      Boys Systolic BP Percentile      Boys Diastolic BP Percentile      Pulse Rate 86     Resp 16     Temp 97.8 F (36.6 C)     Temp Source Oral     SpO2 98 %     Weight      Height      Head Circumference      Peak Flow      Pain Score 10     Pain Loc      Pain Education      Exclude from Growth Chart    No data found.  Updated  Vital Signs BP (!) 178/101 (BP Location: Left Arm)   Pulse 86   Temp 97.8 F (36.6 C) (Oral)   Resp 16   SpO2 98%   Visual Acuity Right Eye Distance:   Left Eye Distance:   Bilateral Distance:    Right Eye Near:   Left Eye Near:    Bilateral Near:     Physical Exam Constitutional:      Appearance: Normal appearance. He is normal weight.  Musculoskeletal:     Comments: The top of the right foot appears red and swollen;  he has TTP from the 1st metatarsal to the 4th metatarsal;  the area is warm;  No swelling or TTP to the ankle or lower leg.   Neurological:     General: No focal deficit present.     Mental Status: He is alert.  Psychiatric:        Mood and Affect: Mood normal.      UC Treatments / Results  Labs (all labs ordered are listed, but only abnormal results are displayed) Labs Reviewed - No data to display  EKG   Radiology DG Foot Complete Right Result Date: 09/02/2024 EXAM: 3 OR MORE VIEW(S) XRAY OF THE RIGHT FOOT 09/02/2024 10:39:28 AM COMPARISON: None available. CLINICAL HISTORY: foot pain and swelling x 1 month. Table formatting from the original  note was not included.; Patient here today with c/o right foot pain and swelling for over a month now. Patient has a h/o bunions. Patient had some swelling in his left foot but that went down. FINDINGS: BONES AND JOINTS: Moderate hallux valgus deformity of first metatarsophalangeal joint is noted. Probable old fracture involving proximal base of second proximal phalanx. No acute fracture. No joint dislocation. SOFT TISSUES: Vascular calcifications are noted. IMPRESSION: 1. Moderate hallux valgus deformity of the first metatarsophalangeal joint. 2. Probable remote fracture involving the proximal base of the second proximal phalanx. 3. Vascular calcifications. Electronically signed by: Lynwood Seip MD 09/02/2024 11:27 AM EDT RP Workstation: [id number]    Procedures Procedures (including critical care time)  Medications Ordered in UC Medications - No data to display  Initial Impression / Assessment and Plan / UC Course  I have reviewed the triage vital signs and the nursing notes.  Pertinent labs & imaging results that were available during my care of the patient were reviewed by me and considered in my medical decision making (see chart for details).  Patient is seen today for foot pain.  Radiology questions a possible fracture at the base of the 2nd proximal phalanx.  However, this is not where the patient is having pain, and I do not think a cause for his symptoms. Either way, he is given a boot today and advised to follow up with his pcp or ortho.   Final Clinical Impressions(s) / UC Diagnoses   Final diagnoses:  Right foot pain     Discharge Instructions      You were seen today for foot pain and swelling.  Your xray did not show any acute abnormality where you are having pain.  I will send out oral prednisone  to take for several days to see if helpful.  I have also given you a boot If this does not help, or if you have continuing issues, you should follow up with your primary care  provider.  Or, you  may follow up with an orthopedist by calling [facility] at [phone].      ED Prescriptions     Medication  Sig Dispense Auth. Provider   predniSONE  (DELTASONE ) 50 MG tablet Take 1 tablet (50 mg total) by mouth daily for 5 days. 5 tablet Darral Longs, MD      PDMP not reviewed this encounter.   Darral Longs, MD 09/02/24 1137
